# Patient Record
Sex: Female | Born: 1935 | Race: Black or African American | Hispanic: No | Marital: Single | Smoking: Former smoker
Health system: Southern US, Community
[De-identification: ages and names within clinical notes are randomized; demographics above are authoritative.]

## PROBLEM LIST (undated history)

## (undated) ENCOUNTER — Emergency Department (HOSPITAL_COMMUNITY): Admission: EM | Payer: Medicare Other

## (undated) DIAGNOSIS — K222 Esophageal obstruction: Secondary | ICD-10-CM

## (undated) DIAGNOSIS — IMO0002 Reserved for concepts with insufficient information to code with codable children: Secondary | ICD-10-CM

## (undated) DIAGNOSIS — R911 Solitary pulmonary nodule: Secondary | ICD-10-CM

## (undated) DIAGNOSIS — R1310 Dysphagia, unspecified: Secondary | ICD-10-CM

## (undated) DIAGNOSIS — J439 Emphysema, unspecified: Secondary | ICD-10-CM

## (undated) DIAGNOSIS — K5730 Diverticulosis of large intestine without perforation or abscess without bleeding: Secondary | ICD-10-CM

## (undated) DIAGNOSIS — R9430 Abnormal result of cardiovascular function study, unspecified: Secondary | ICD-10-CM

## (undated) DIAGNOSIS — R079 Chest pain, unspecified: Secondary | ICD-10-CM

## (undated) HISTORY — DX: Dysphagia, unspecified: R13.10

## (undated) HISTORY — DX: Esophageal obstruction: K22.2

## (undated) HISTORY — DX: Reserved for concepts with insufficient information to code with codable children: IMO0002

## (undated) HISTORY — DX: Diverticulosis of large intestine without perforation or abscess without bleeding: K57.30

## (undated) HISTORY — DX: Emphysema, unspecified: J43.9

## (undated) HISTORY — DX: Abnormal result of cardiovascular function study, unspecified: R94.30

## (undated) HISTORY — DX: Solitary pulmonary nodule: R91.1

## (undated) HISTORY — DX: Chest pain, unspecified: R07.9

---

## 1998-01-20 ENCOUNTER — Ambulatory Visit (HOSPITAL_COMMUNITY): Admission: RE | Admit: 1998-01-20 | Discharge: 1998-01-20 | Payer: Self-pay | Admitting: General Surgery

## 1998-03-02 ENCOUNTER — Other Ambulatory Visit: Admission: RE | Admit: 1998-03-02 | Discharge: 1998-03-02 | Payer: Self-pay | Admitting: Internal Medicine

## 1998-04-06 ENCOUNTER — Ambulatory Visit (HOSPITAL_COMMUNITY): Admission: RE | Admit: 1998-04-06 | Discharge: 1998-04-06 | Payer: Self-pay | Admitting: General Surgery

## 1998-06-28 ENCOUNTER — Ambulatory Visit (HOSPITAL_COMMUNITY): Admission: RE | Admit: 1998-06-28 | Discharge: 1998-06-28 | Payer: Self-pay | Admitting: Cardiology

## 1998-06-28 ENCOUNTER — Encounter: Payer: Self-pay | Admitting: Cardiology

## 1998-11-22 ENCOUNTER — Other Ambulatory Visit: Admission: RE | Admit: 1998-11-22 | Discharge: 1998-11-22 | Payer: Self-pay | Admitting: Obstetrics and Gynecology

## 1999-04-24 ENCOUNTER — Emergency Department (HOSPITAL_COMMUNITY): Admission: EM | Admit: 1999-04-24 | Discharge: 1999-04-25 | Payer: Self-pay | Admitting: *Deleted

## 1999-05-16 ENCOUNTER — Encounter: Payer: Self-pay | Admitting: Internal Medicine

## 1999-05-16 ENCOUNTER — Ambulatory Visit (HOSPITAL_COMMUNITY): Admission: RE | Admit: 1999-05-16 | Discharge: 1999-05-16 | Payer: Self-pay | Admitting: Internal Medicine

## 1999-06-19 ENCOUNTER — Ambulatory Visit (HOSPITAL_COMMUNITY): Admission: RE | Admit: 1999-06-19 | Discharge: 1999-06-19 | Payer: Self-pay | Admitting: *Deleted

## 1999-10-11 ENCOUNTER — Other Ambulatory Visit: Admission: RE | Admit: 1999-10-11 | Discharge: 1999-10-11 | Payer: Self-pay | Admitting: *Deleted

## 1999-11-27 ENCOUNTER — Ambulatory Visit (HOSPITAL_COMMUNITY): Admission: RE | Admit: 1999-11-27 | Discharge: 1999-11-27 | Payer: Self-pay | Admitting: *Deleted

## 2000-02-23 ENCOUNTER — Emergency Department (HOSPITAL_COMMUNITY): Admission: EM | Admit: 2000-02-23 | Discharge: 2000-02-23 | Payer: Self-pay | Admitting: Emergency Medicine

## 2000-02-23 ENCOUNTER — Encounter: Payer: Self-pay | Admitting: Emergency Medicine

## 2000-03-30 ENCOUNTER — Emergency Department (HOSPITAL_COMMUNITY): Admission: EM | Admit: 2000-03-30 | Discharge: 2000-03-30 | Payer: Self-pay | Admitting: Emergency Medicine

## 2000-03-30 ENCOUNTER — Encounter: Payer: Self-pay | Admitting: Emergency Medicine

## 2000-04-22 ENCOUNTER — Encounter: Payer: Self-pay | Admitting: Orthopedic Surgery

## 2000-04-22 ENCOUNTER — Ambulatory Visit (HOSPITAL_COMMUNITY): Admission: RE | Admit: 2000-04-22 | Discharge: 2000-04-22 | Payer: Self-pay | Admitting: Orthopedic Surgery

## 2000-04-26 ENCOUNTER — Encounter: Admission: RE | Admit: 2000-04-26 | Discharge: 2000-07-25 | Payer: Self-pay | Admitting: Otolaryngology

## 2000-05-28 ENCOUNTER — Encounter: Payer: Self-pay | Admitting: Neurosurgery

## 2000-05-28 ENCOUNTER — Ambulatory Visit (HOSPITAL_COMMUNITY): Admission: RE | Admit: 2000-05-28 | Discharge: 2000-05-28 | Payer: Self-pay | Admitting: Neurosurgery

## 2000-07-23 ENCOUNTER — Emergency Department (HOSPITAL_COMMUNITY): Admission: EM | Admit: 2000-07-23 | Discharge: 2000-07-24 | Payer: Self-pay | Admitting: Emergency Medicine

## 2000-07-23 ENCOUNTER — Encounter: Payer: Self-pay | Admitting: Emergency Medicine

## 2000-07-24 ENCOUNTER — Encounter: Payer: Self-pay | Admitting: Emergency Medicine

## 2000-08-01 ENCOUNTER — Encounter: Admission: RE | Admit: 2000-08-01 | Discharge: 2000-08-01 | Payer: Self-pay | Admitting: Urology

## 2000-08-01 ENCOUNTER — Encounter: Payer: Self-pay | Admitting: Urology

## 2000-10-08 ENCOUNTER — Encounter: Payer: Self-pay | Admitting: Urology

## 2000-10-08 ENCOUNTER — Encounter: Admission: RE | Admit: 2000-10-08 | Discharge: 2000-10-08 | Payer: Self-pay | Admitting: Urology

## 2000-11-25 ENCOUNTER — Other Ambulatory Visit: Admission: RE | Admit: 2000-11-25 | Discharge: 2000-11-25 | Payer: Self-pay | Admitting: *Deleted

## 2001-03-18 ENCOUNTER — Encounter (INDEPENDENT_AMBULATORY_CARE_PROVIDER_SITE_OTHER): Payer: Self-pay | Admitting: Specialist

## 2001-03-18 ENCOUNTER — Other Ambulatory Visit: Admission: RE | Admit: 2001-03-18 | Discharge: 2001-03-18 | Payer: Self-pay | Admitting: Internal Medicine

## 2001-06-30 ENCOUNTER — Encounter: Admission: RE | Admit: 2001-06-30 | Discharge: 2001-09-28 | Payer: Self-pay | Admitting: *Deleted

## 2001-09-19 ENCOUNTER — Encounter: Payer: Self-pay | Admitting: Emergency Medicine

## 2001-09-19 ENCOUNTER — Emergency Department (HOSPITAL_COMMUNITY): Admission: EM | Admit: 2001-09-19 | Discharge: 2001-09-19 | Payer: Self-pay | Admitting: Emergency Medicine

## 2001-11-04 ENCOUNTER — Ambulatory Visit (HOSPITAL_COMMUNITY): Admission: RE | Admit: 2001-11-04 | Discharge: 2001-11-04 | Payer: Self-pay | Admitting: Internal Medicine

## 2001-11-04 ENCOUNTER — Encounter: Payer: Self-pay | Admitting: Internal Medicine

## 2002-01-21 ENCOUNTER — Emergency Department (HOSPITAL_COMMUNITY): Admission: EM | Admit: 2002-01-21 | Discharge: 2002-01-22 | Payer: Self-pay | Admitting: Emergency Medicine

## 2002-05-14 ENCOUNTER — Encounter
Admission: RE | Admit: 2002-05-14 | Discharge: 2002-05-29 | Payer: Self-pay | Admitting: Physical Medicine and Rehabilitation

## 2003-05-01 ENCOUNTER — Emergency Department (HOSPITAL_COMMUNITY): Admission: EM | Admit: 2003-05-01 | Discharge: 2003-05-01 | Payer: Self-pay

## 2003-05-10 ENCOUNTER — Ambulatory Visit (HOSPITAL_COMMUNITY): Admission: RE | Admit: 2003-05-10 | Discharge: 2003-05-10 | Payer: Self-pay | Admitting: Internal Medicine

## 2003-05-10 ENCOUNTER — Encounter: Payer: Self-pay | Admitting: Internal Medicine

## 2004-02-02 ENCOUNTER — Emergency Department (HOSPITAL_COMMUNITY): Admission: EM | Admit: 2004-02-02 | Discharge: 2004-02-02 | Payer: Self-pay | Admitting: Emergency Medicine

## 2004-03-02 ENCOUNTER — Ambulatory Visit: Admission: RE | Admit: 2004-03-02 | Discharge: 2004-03-02 | Payer: Self-pay | Admitting: Internal Medicine

## 2004-05-22 ENCOUNTER — Ambulatory Visit (HOSPITAL_COMMUNITY): Admission: RE | Admit: 2004-05-22 | Discharge: 2004-05-22 | Payer: Self-pay | Admitting: Internal Medicine

## 2004-10-09 ENCOUNTER — Ambulatory Visit: Payer: Self-pay | Admitting: Internal Medicine

## 2004-10-10 ENCOUNTER — Ambulatory Visit: Payer: Self-pay | Admitting: Internal Medicine

## 2005-01-30 ENCOUNTER — Ambulatory Visit (HOSPITAL_COMMUNITY): Admission: RE | Admit: 2005-01-30 | Discharge: 2005-01-30 | Payer: Self-pay | Admitting: Internal Medicine

## 2005-02-18 ENCOUNTER — Emergency Department (HOSPITAL_COMMUNITY): Admission: EM | Admit: 2005-02-18 | Discharge: 2005-02-18 | Payer: Self-pay | Admitting: *Deleted

## 2005-03-19 ENCOUNTER — Ambulatory Visit: Payer: Self-pay | Admitting: Internal Medicine

## 2005-06-18 ENCOUNTER — Ambulatory Visit: Payer: Self-pay | Admitting: Internal Medicine

## 2005-06-20 ENCOUNTER — Ambulatory Visit: Payer: Self-pay | Admitting: Cardiology

## 2005-10-22 ENCOUNTER — Ambulatory Visit (HOSPITAL_COMMUNITY): Admission: RE | Admit: 2005-10-22 | Discharge: 2005-10-22 | Payer: Self-pay | Admitting: Internal Medicine

## 2005-11-01 ENCOUNTER — Ambulatory Visit: Payer: Self-pay | Admitting: Internal Medicine

## 2005-12-12 ENCOUNTER — Ambulatory Visit: Payer: Self-pay | Admitting: Cardiology

## 2005-12-21 ENCOUNTER — Ambulatory Visit: Payer: Self-pay

## 2005-12-24 ENCOUNTER — Ambulatory Visit: Payer: Self-pay | Admitting: Cardiology

## 2006-01-10 ENCOUNTER — Ambulatory Visit: Payer: Self-pay | Admitting: Internal Medicine

## 2006-01-15 ENCOUNTER — Ambulatory Visit: Payer: Self-pay | Admitting: *Deleted

## 2006-01-25 ENCOUNTER — Ambulatory Visit: Payer: Self-pay | Admitting: Internal Medicine

## 2006-01-28 ENCOUNTER — Ambulatory Visit (HOSPITAL_COMMUNITY): Admission: RE | Admit: 2006-01-28 | Discharge: 2006-01-28 | Payer: Self-pay | Admitting: Internal Medicine

## 2006-02-25 ENCOUNTER — Ambulatory Visit: Payer: Self-pay | Admitting: Internal Medicine

## 2006-03-06 ENCOUNTER — Ambulatory Visit: Payer: Self-pay | Admitting: Internal Medicine

## 2006-05-03 ENCOUNTER — Ambulatory Visit: Payer: Self-pay

## 2006-08-20 ENCOUNTER — Ambulatory Visit: Payer: Self-pay | Admitting: Internal Medicine

## 2006-09-02 ENCOUNTER — Ambulatory Visit: Payer: Self-pay | Admitting: Internal Medicine

## 2006-11-29 ENCOUNTER — Ambulatory Visit: Payer: Self-pay | Admitting: Cardiology

## 2007-04-21 ENCOUNTER — Ambulatory Visit: Payer: Self-pay | Admitting: Cardiology

## 2007-04-22 ENCOUNTER — Ambulatory Visit: Payer: Self-pay | Admitting: Surgery

## 2007-04-22 ENCOUNTER — Encounter (INDEPENDENT_AMBULATORY_CARE_PROVIDER_SITE_OTHER): Payer: Self-pay | Admitting: *Deleted

## 2007-04-22 ENCOUNTER — Inpatient Hospital Stay (HOSPITAL_COMMUNITY): Admission: EM | Admit: 2007-04-22 | Discharge: 2007-04-24 | Payer: Self-pay | Admitting: Emergency Medicine

## 2007-04-23 ENCOUNTER — Encounter (INDEPENDENT_AMBULATORY_CARE_PROVIDER_SITE_OTHER): Payer: Self-pay | Admitting: Internal Medicine

## 2008-02-17 ENCOUNTER — Ambulatory Visit: Payer: Self-pay | Admitting: Vascular Surgery

## 2008-05-28 ENCOUNTER — Inpatient Hospital Stay (HOSPITAL_COMMUNITY): Admission: EM | Admit: 2008-05-28 | Discharge: 2008-05-31 | Payer: Self-pay | Admitting: Emergency Medicine

## 2008-07-12 DIAGNOSIS — I2510 Atherosclerotic heart disease of native coronary artery without angina pectoris: Secondary | ICD-10-CM | POA: Insufficient documentation

## 2008-07-12 DIAGNOSIS — K319 Disease of stomach and duodenum, unspecified: Secondary | ICD-10-CM | POA: Insufficient documentation

## 2008-07-12 DIAGNOSIS — J438 Other emphysema: Secondary | ICD-10-CM | POA: Insufficient documentation

## 2008-07-12 DIAGNOSIS — K648 Other hemorrhoids: Secondary | ICD-10-CM | POA: Insufficient documentation

## 2008-07-12 DIAGNOSIS — J984 Other disorders of lung: Secondary | ICD-10-CM | POA: Insufficient documentation

## 2008-07-12 DIAGNOSIS — K219 Gastro-esophageal reflux disease without esophagitis: Secondary | ICD-10-CM

## 2008-07-12 DIAGNOSIS — I219 Acute myocardial infarction, unspecified: Secondary | ICD-10-CM | POA: Insufficient documentation

## 2008-07-12 DIAGNOSIS — K5730 Diverticulosis of large intestine without perforation or abscess without bleeding: Secondary | ICD-10-CM | POA: Insufficient documentation

## 2008-07-13 ENCOUNTER — Ambulatory Visit: Payer: Self-pay | Admitting: Internal Medicine

## 2008-07-13 DIAGNOSIS — R1319 Other dysphagia: Secondary | ICD-10-CM

## 2008-07-20 ENCOUNTER — Ambulatory Visit: Payer: Self-pay | Admitting: Internal Medicine

## 2008-07-20 ENCOUNTER — Telehealth: Payer: Self-pay | Admitting: Internal Medicine

## 2008-12-28 ENCOUNTER — Ambulatory Visit: Payer: Self-pay | Admitting: Vascular Surgery

## 2008-12-28 ENCOUNTER — Ambulatory Visit (HOSPITAL_COMMUNITY): Admission: RE | Admit: 2008-12-28 | Discharge: 2008-12-28 | Payer: Self-pay | Admitting: Internal Medicine

## 2009-02-24 ENCOUNTER — Encounter: Admission: RE | Admit: 2009-02-24 | Discharge: 2009-02-24 | Payer: Self-pay | Admitting: Internal Medicine

## 2009-10-02 ENCOUNTER — Encounter: Admission: RE | Admit: 2009-10-02 | Discharge: 2009-10-02 | Payer: Self-pay | Admitting: Orthopaedic Surgery

## 2009-10-28 ENCOUNTER — Observation Stay (HOSPITAL_COMMUNITY): Admission: EM | Admit: 2009-10-28 | Discharge: 2009-10-29 | Payer: Self-pay | Admitting: Emergency Medicine

## 2009-12-20 ENCOUNTER — Emergency Department (HOSPITAL_COMMUNITY): Admission: EM | Admit: 2009-12-20 | Discharge: 2009-12-20 | Payer: Self-pay | Admitting: Emergency Medicine

## 2009-12-20 ENCOUNTER — Encounter (INDEPENDENT_AMBULATORY_CARE_PROVIDER_SITE_OTHER): Payer: Self-pay | Admitting: Emergency Medicine

## 2009-12-20 ENCOUNTER — Ambulatory Visit: Payer: Self-pay | Admitting: Vascular Surgery

## 2010-01-16 ENCOUNTER — Emergency Department (HOSPITAL_COMMUNITY): Admission: EM | Admit: 2010-01-16 | Discharge: 2010-01-16 | Payer: Self-pay | Admitting: *Deleted

## 2010-02-09 ENCOUNTER — Ambulatory Visit (HOSPITAL_COMMUNITY): Admission: RE | Admit: 2010-02-09 | Discharge: 2010-02-09 | Payer: Self-pay | Admitting: Internal Medicine

## 2010-02-24 ENCOUNTER — Inpatient Hospital Stay (HOSPITAL_COMMUNITY): Admission: EM | Admit: 2010-02-24 | Discharge: 2010-02-26 | Payer: Self-pay | Admitting: Emergency Medicine

## 2010-08-27 ENCOUNTER — Encounter: Payer: Self-pay | Admitting: Internal Medicine

## 2010-10-21 LAB — TROPONIN I: Troponin I: 0.01 ng/mL (ref 0.00–0.06)

## 2010-10-21 LAB — DIFFERENTIAL
Basophils Relative: 1 % (ref 0–1)
Eosinophils Absolute: 0 10*3/uL (ref 0.0–0.7)
Lymphs Abs: 1.5 10*3/uL (ref 0.7–4.0)
Neutro Abs: 3.1 10*3/uL (ref 1.7–7.7)
Neutrophils Relative %: 62 % (ref 43–77)

## 2010-10-21 LAB — COMPREHENSIVE METABOLIC PANEL
ALT: 9 U/L (ref 0–35)
AST: 16 U/L (ref 0–37)
Albumin: 3.9 g/dL (ref 3.5–5.2)
Alkaline Phosphatase: 49 U/L (ref 39–117)
BUN: 3 mg/dL — ABNORMAL LOW (ref 6–23)
Chloride: 106 mEq/L (ref 96–112)
Potassium: 3.1 mEq/L — ABNORMAL LOW (ref 3.5–5.1)
Sodium: 142 mEq/L (ref 135–145)
Total Bilirubin: 1.1 mg/dL (ref 0.3–1.2)
Total Protein: 6.5 g/dL (ref 6.0–8.3)

## 2010-10-21 LAB — CK TOTAL AND CKMB (NOT AT ARMC): CK, MB: 0.8 ng/mL (ref 0.3–4.0)

## 2010-10-21 LAB — BASIC METABOLIC PANEL
BUN: 4 mg/dL — ABNORMAL LOW (ref 6–23)
Calcium: 9.1 mg/dL (ref 8.4–10.5)
Chloride: 109 mEq/L (ref 96–112)
Creatinine, Ser: 0.93 mg/dL (ref 0.4–1.2)
Creatinine, Ser: 1 mg/dL (ref 0.4–1.2)
GFR calc Af Amer: >60 mL/min (ref >60)
GFR calc non Af Amer: 59 mL/min — ABNORMAL LOW (ref >60)

## 2010-10-21 LAB — CARDIAC PANEL(CRET KIN+CKTOT+MB+TROPI)
CK, MB: 0.8 ng/mL (ref 0.3–4.0)
Relative Index: INVALID (ref 0.0–2.5)
Troponin I: 0.01 ng/mL (ref 0.00–0.06)
Troponin I: 0.01 ng/mL (ref 0.00–0.06)

## 2010-10-21 LAB — CBC
HCT: 41.6 % (ref 36.0–46.0)
MCH: 30.5 pg (ref 26.0–34.0)
MCV: 89.2 fL (ref 78.0–100.0)
Platelets: 136 10*3/uL — ABNORMAL LOW (ref 150–400)
Platelets: 148 10*3/uL — ABNORMAL LOW (ref 150–400)
RBC: 4.24 MIL/uL (ref 3.87–5.11)
RBC: 4.66 MIL/uL (ref 3.87–5.11)
RDW: 14 % (ref 11.5–15.5)
WBC: 5.1 10*3/uL (ref 4.0–10.5)
WBC: 5.4 10*3/uL (ref 4.0–10.5)

## 2010-10-21 LAB — URINALYSIS, ROUTINE W REFLEX MICROSCOPIC
Glucose, UA: NEGATIVE mg/dL
Hgb urine dipstick: NEGATIVE
Ketones, ur: 15 mg/dL — AB
Protein, ur: NEGATIVE mg/dL
pH: 7 (ref 5.0–8.0)

## 2010-10-21 LAB — LIPASE, BLOOD: Lipase: 107 U/L — ABNORMAL HIGH (ref 11–59)

## 2010-10-21 LAB — GLUCOSE, CAPILLARY: Glucose-Capillary: 128 mg/dL — ABNORMAL HIGH (ref 70–99)

## 2010-10-21 LAB — URINE CULTURE
Colony Count: 25000
Culture  Setup Time: 201107230100

## 2010-10-21 LAB — POCT CARDIAC MARKERS

## 2010-10-21 LAB — D-DIMER, QUANTITATIVE: D-Dimer, Quant: 0.82 ug/mL-FEU — ABNORMAL HIGH (ref 0.00–0.48)

## 2010-10-21 LAB — LIPID PANEL
HDL: 45 mg/dL (ref >39)
Total CHOL/HDL Ratio: 3.1 RATIO
Triglycerides: 76 mg/dL (ref <150)
VLDL: 15 mg/dL (ref 0–40)

## 2010-10-21 LAB — MAGNESIUM: Magnesium: 2 mg/dL (ref 1.5–2.5)

## 2010-10-21 LAB — URINE MICROSCOPIC-ADD ON

## 2010-10-23 LAB — DIFFERENTIAL
Basophils Absolute: 0 10*3/uL (ref 0.0–0.1)
Basophils Absolute: 0 10*3/uL (ref 0.0–0.1)
Lymphocytes Relative: 33 % (ref 12–46)
Lymphocytes Relative: 32 % (ref 12–46)
Lymphs Abs: 1.9 10*3/uL (ref 0.7–4.0)
Neutro Abs: 3.7 10*3/uL (ref 1.7–7.7)
Neutro Abs: 3.6 10*3/uL (ref 1.7–7.7)
Neutrophils Relative %: 60 % (ref 43–77)

## 2010-10-23 LAB — BASIC METABOLIC PANEL
BUN: 8 mg/dL (ref 6–23)
Calcium: 9.2 mg/dL (ref 8.4–10.5)
Calcium: 9.1 mg/dL (ref 8.4–10.5)
Creatinine, Ser: 1.06 mg/dL (ref 0.4–1.2)
Creatinine, Ser: 0.99 mg/dL (ref 0.4–1.2)
GFR calc non Af Amer: 51 mL/min — ABNORMAL LOW (ref >60)
GFR calc non Af Amer: 55 mL/min — ABNORMAL LOW (ref >60)
Glucose, Bld: 101 mg/dL — ABNORMAL HIGH (ref 70–99)
Glucose, Bld: 93 mg/dL (ref 70–99)
Sodium: 143 mEq/L (ref 135–145)

## 2010-10-23 LAB — URINE CULTURE
Colony Count: NO GROWTH
Culture: NO GROWTH

## 2010-10-23 LAB — URINALYSIS, ROUTINE W REFLEX MICROSCOPIC
Glucose, UA: NEGATIVE mg/dL
Glucose, UA: NEGATIVE mg/dL
Hgb urine dipstick: NEGATIVE
Ketones, ur: NEGATIVE mg/dL
Ketones, ur: NEGATIVE mg/dL
Nitrite: NEGATIVE
Protein, ur: NEGATIVE mg/dL
Specific Gravity, Urine: 1.01 (ref 1.005–1.030)
pH: 8 (ref 5.0–8.0)

## 2010-10-23 LAB — URINE MICROSCOPIC-ADD ON

## 2010-10-23 LAB — HEPATIC FUNCTION PANEL
ALT: 12 U/L (ref 0–35)
ALT: 10 U/L (ref 0–35)
AST: 18 U/L (ref 0–37)
AST: 15 U/L (ref 0–37)
Bilirubin, Direct: <0.1 mg/dL (ref 0.0–0.3)
Indirect Bilirubin: 0.5 mg/dL (ref 0.3–0.9)
Total Protein: 6.6 g/dL (ref 6.0–8.3)
Total Protein: 6.2 g/dL (ref 6.0–8.3)

## 2010-10-23 LAB — CBC
HCT: 37.3 % (ref 36.0–46.0)
Platelets: 163 10*3/uL (ref 150–400)
Platelets: 179 10*3/uL (ref 150–400)
RDW: 13.3 % (ref 11.5–15.5)
RDW: 14 % (ref 11.5–15.5)
WBC: 6 10*3/uL (ref 4.0–10.5)

## 2010-10-23 LAB — POCT CARDIAC MARKERS
CKMB, poc: <1 ng/mL — ABNORMAL LOW (ref 1.0–8.0)
CKMB, poc: <1 ng/mL — ABNORMAL LOW (ref 1.0–8.0)
Myoglobin, poc: 40.4 ng/mL (ref 12–200)
Myoglobin, poc: 51.1 ng/mL (ref 12–200)

## 2010-10-30 LAB — BASIC METABOLIC PANEL
CO2: 27 mEq/L (ref 19–32)
Calcium: 9.5 mg/dL (ref 8.4–10.5)
Chloride: 108 mEq/L (ref 96–112)
Creatinine, Ser: 0.97 mg/dL (ref 0.4–1.2)
Creatinine, Ser: 1.12 mg/dL (ref 0.4–1.2)
GFR calc Af Amer: 58 mL/min — ABNORMAL LOW (ref >60)
GFR calc Af Amer: >60 mL/min (ref >60)
GFR calc non Af Amer: 48 mL/min — ABNORMAL LOW (ref >60)
Glucose, Bld: 157 mg/dL — ABNORMAL HIGH (ref 70–99)
Potassium: 4.1 mEq/L (ref 3.5–5.1)

## 2010-10-30 LAB — URINALYSIS, ROUTINE W REFLEX MICROSCOPIC
Bilirubin Urine: NEGATIVE
Glucose, UA: NEGATIVE mg/dL
Hgb urine dipstick: NEGATIVE
Ketones, ur: NEGATIVE mg/dL
pH: 5.5 (ref 5.0–8.0)

## 2010-10-30 LAB — DIFFERENTIAL
Basophils Absolute: 0 10*3/uL (ref 0.0–0.1)
Basophils Relative: 0 % (ref 0–1)
Lymphocytes Relative: 22 % (ref 12–46)
Neutro Abs: 5.1 10*3/uL (ref 1.7–7.7)
Neutrophils Relative %: 70 % (ref 43–77)

## 2010-10-30 LAB — LIPID PANEL
HDL: 46 mg/dL (ref >39)
Total CHOL/HDL Ratio: 4.3 RATIO

## 2010-10-30 LAB — CBC
MCHC: 34.1 g/dL (ref 30.0–36.0)
RBC: 4.53 MIL/uL (ref 3.87–5.11)
RDW: 14.7 % (ref 11.5–15.5)

## 2010-10-30 LAB — URINE MICROSCOPIC-ADD ON

## 2010-10-30 LAB — CARDIAC PANEL(CRET KIN+CKTOT+MB+TROPI)
CK, MB: 0.7 ng/mL (ref 0.3–4.0)
Relative Index: INVALID (ref 0.0–2.5)
Troponin I: <0.01 ng/mL (ref 0.00–0.06)
Troponin I: <0.01 ng/mL (ref 0.00–0.06)

## 2010-10-30 LAB — CK TOTAL AND CKMB (NOT AT ARMC)
CK, MB: 0.6 ng/mL (ref 0.3–4.0)
Relative Index: INVALID (ref 0.0–2.5)
Total CK: 55 U/L (ref 7–177)

## 2010-10-30 LAB — BRAIN NATRIURETIC PEPTIDE: Pro B Natriuretic peptide (BNP): <30 pg/mL (ref 0.0–100.0)

## 2010-10-30 LAB — LIPASE, BLOOD: Lipase: 59 U/L (ref 11–59)

## 2010-10-30 LAB — POCT CARDIAC MARKERS

## 2010-11-03 IMAGING — CR DG CHEST 2V
2 series · 2 of 2 positions shown · non-contrast
Comparison: 12/20/2009

CLINICAL DATA: Chest pain

CHEST - 2 VIEW

[w chest pa]
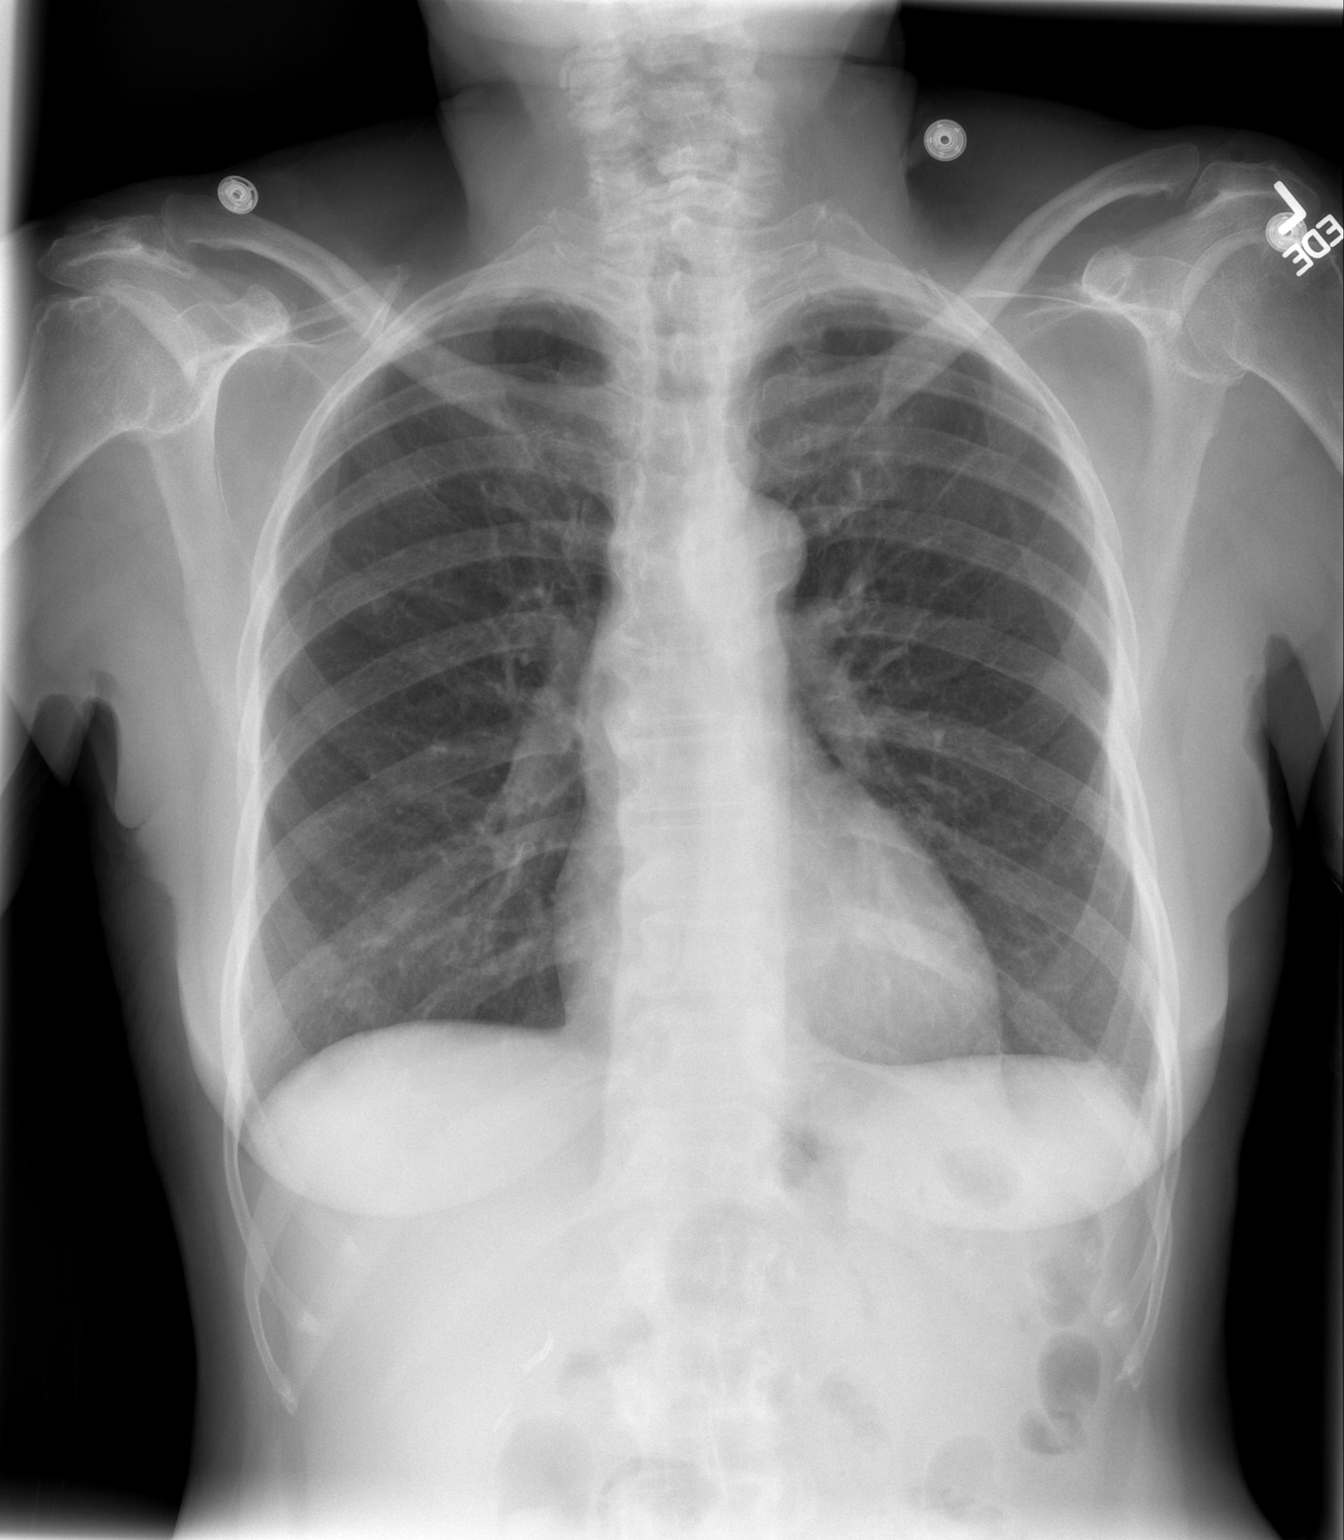

[w chest lat]
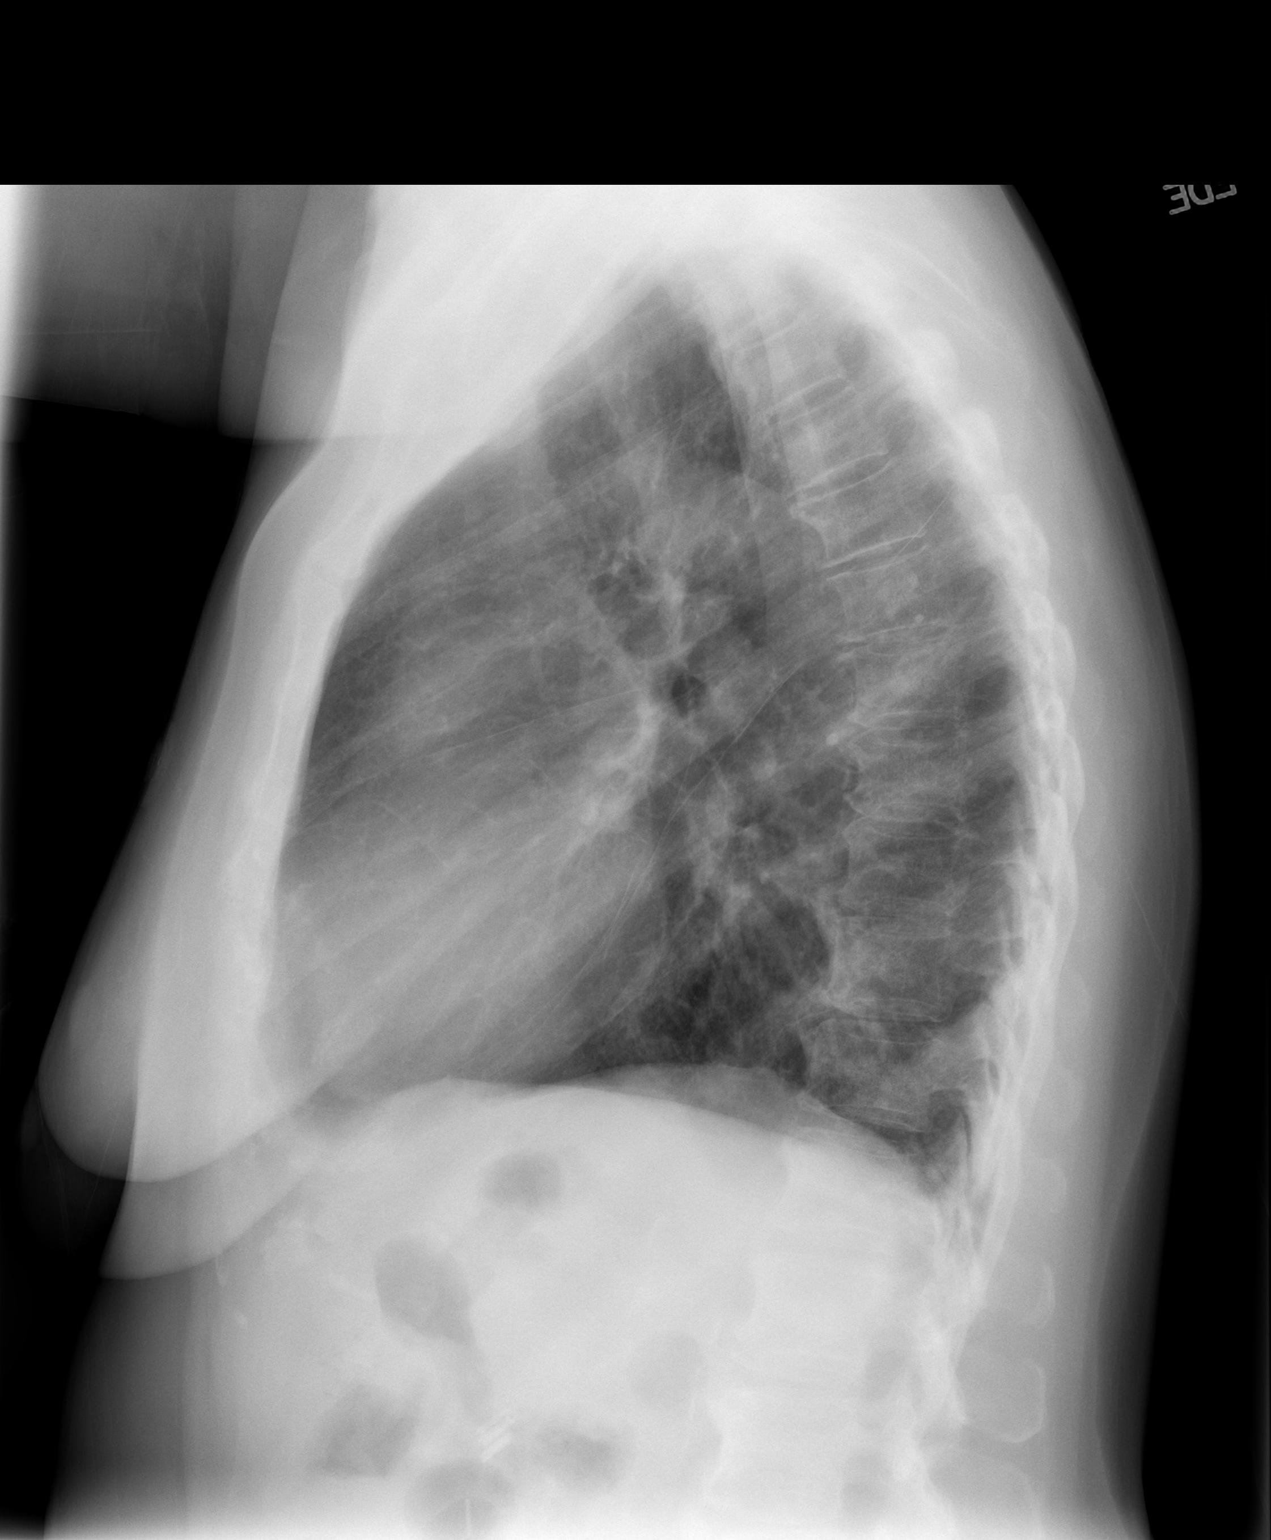

[2 of 2 positions shown; findings below may reference images not displayed]

FINDINGS: Upper normal heart size.  Lungs are clear.  No
pneumothorax.  No pleural effusion.
IMPRESSION: No active cardiopulmonary disease.

## 2010-11-27 IMAGING — CR DG KNEE 1-2V*R*
3 series · 3 of 3 positions shown · non-contrast
Comparison: 12/20/2009

CLINICAL DATA: Right knee pain with swelling.  No known injury.  No
focal point

RIGHT KNEE - 1-2 VIEW

[view not recorded (1 of 3)]
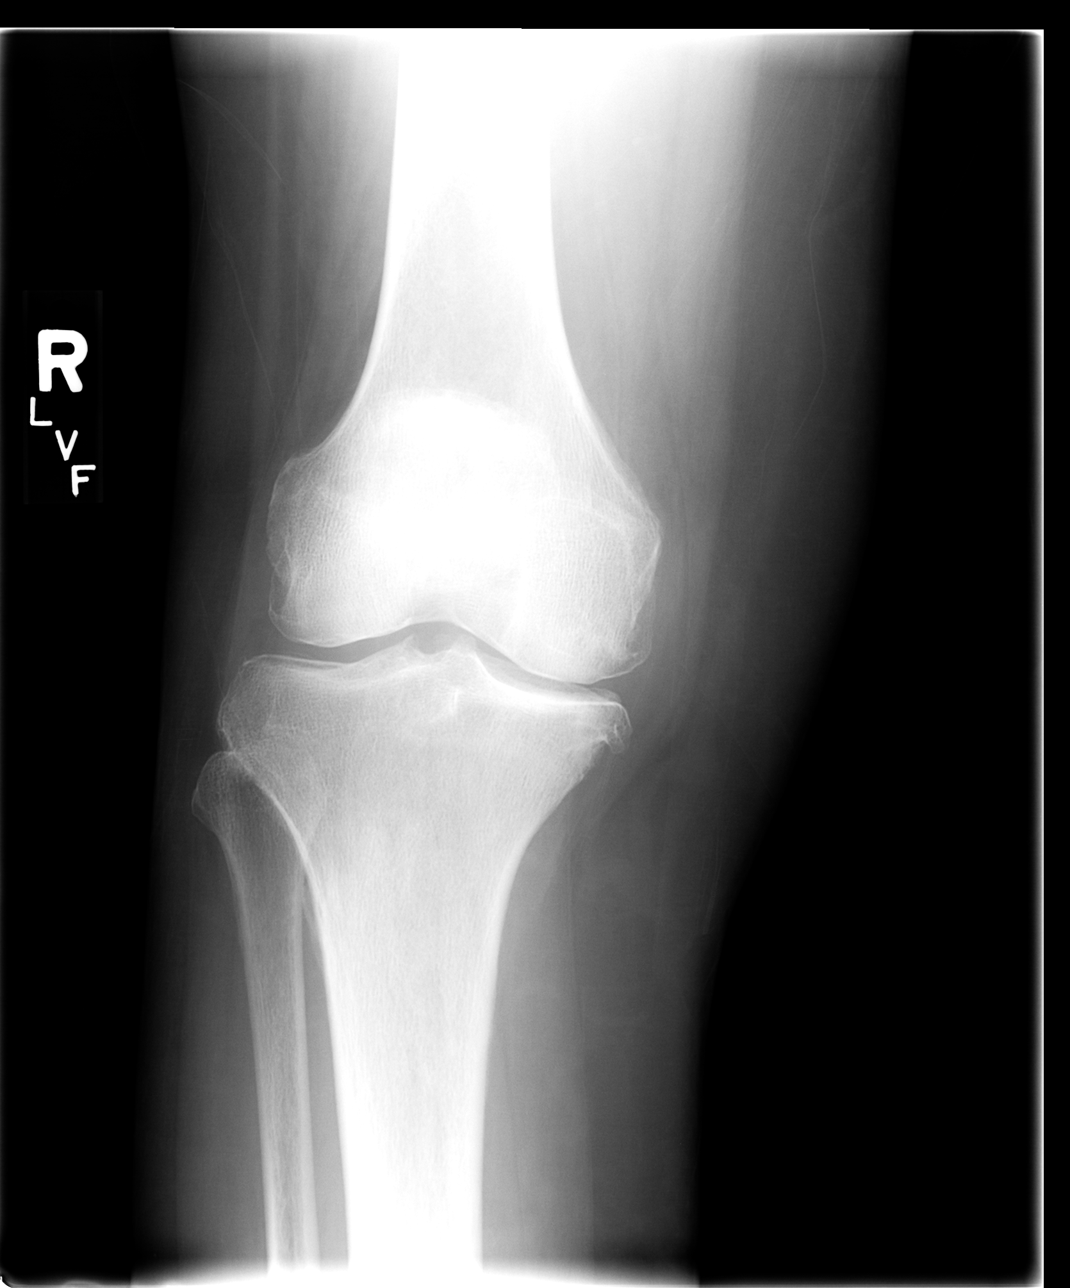

[view not recorded (2 of 3)]
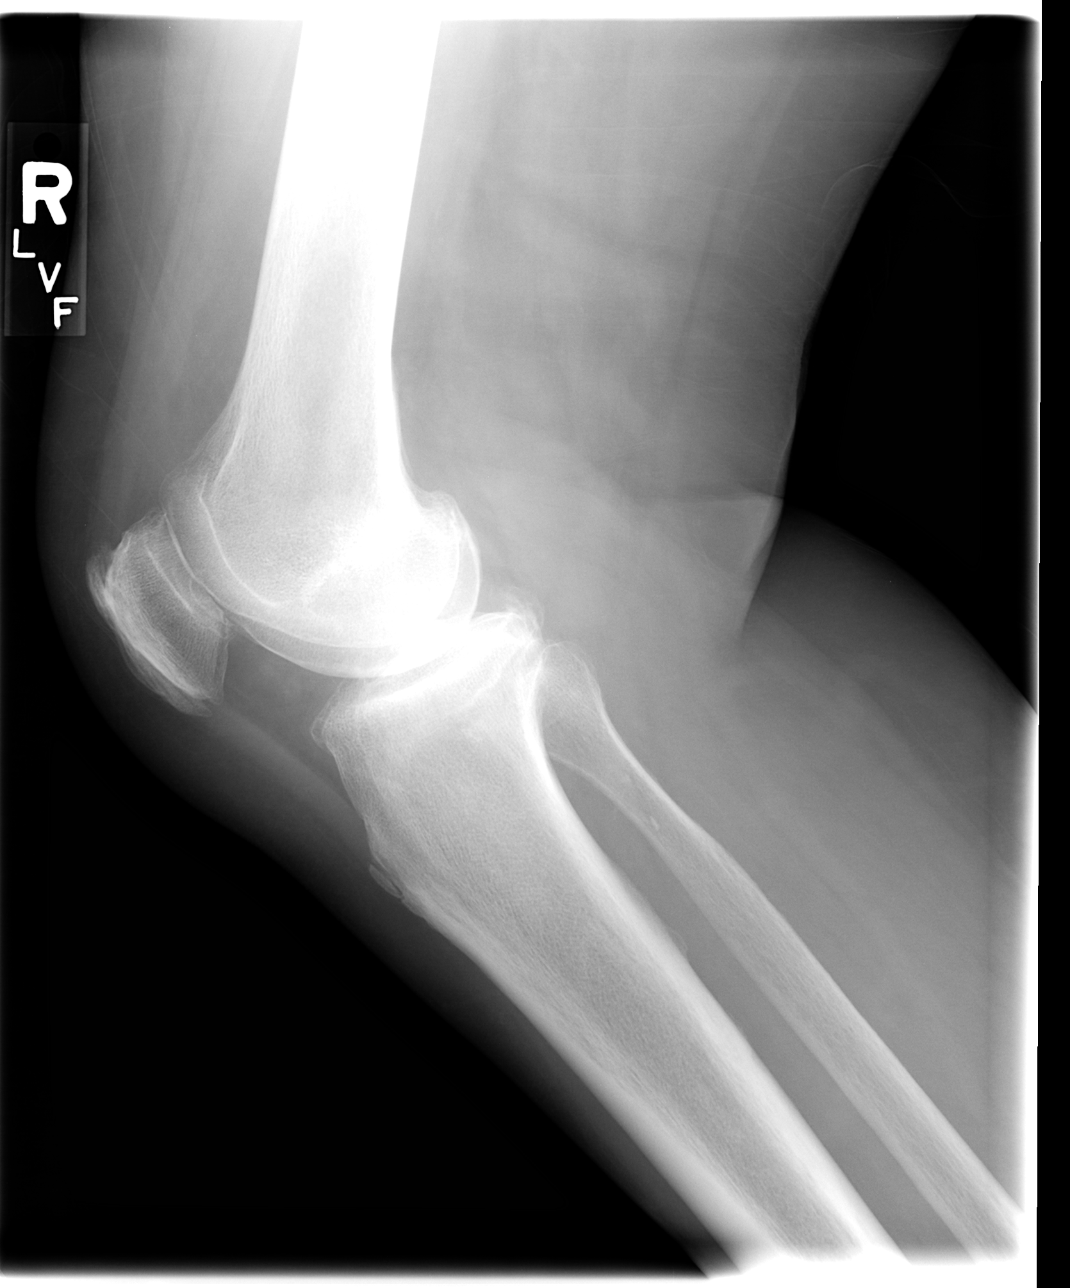

[view not recorded (3 of 3)]
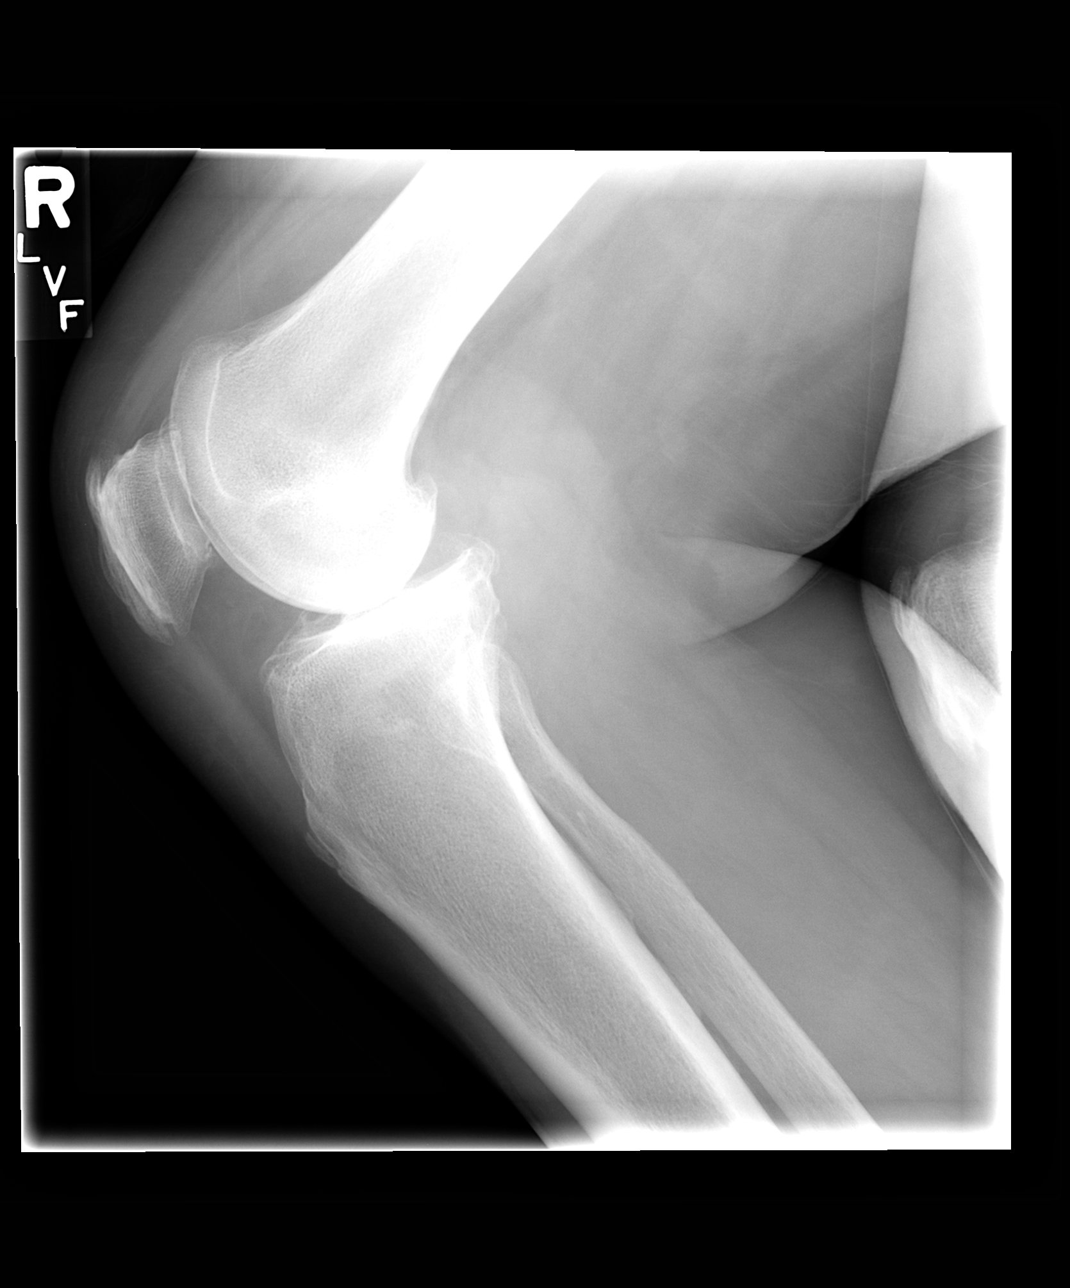

[3 of 3 positions shown; findings below may reference images not displayed]

FINDINGS: Bone density is within normal limits.  There is medial
and patellofemoral joint space narrowing which is stable in
comparison with the prior exam.  Subchondral sclerosis, tibial
spine spurring and medial tibial osteophytosis is also identified
and these findings would correlate with medial compartment
osteoarthritis.  No evidence for acute fracture or dislocation is
seen.  No joint effusion is noted.  No focal bony abnormality is
seen. No soft tissue abnormality is noted.
IMPRESSION: Stable degenerative osteoarthritis of the medial and patellofemoral
compartments.  No new focal or acute abnormality suggested.

## 2010-12-19 NOTE — H&P (Signed)
Sharon [name], [name]              ACCOUNT NO.:  [id number]   MEDICAL RECORD NO.:  [id number]          PATIENT TYPE:  EMS   LOCATION:  ED                           FACILITY:  [facility]   PHYSICIAN:  Beckey Rutter, MD  DATE OF BIRTH:  01/21/1933   DATE OF ADMISSION:  04/21/2007  DATE OF DISCHARGE:                              HISTORY & PHYSICAL   PRIMARY CARE PHYSICIAN:  Lovenia Kim, D.O.   CHIEF COMPLAINT:  Chest pain   HISTORY OF PRESENT ILLNESS:  This is a 77 year old female with past  medical history significant for mild dementia, presented today with  chest pain.  The patient stated the chest pain started since she woke up  this morning.  The pain is mainly in the anterior chest area with  radiation to the left flank.  The pain is pressure in nature and  associated somehow with shortness of breath.  The patient's pain as she  describes it does not radiate to the back or to the neck.  The patient  is around 7/10 and she could not remember relieving factor or  aggravating factor.  The patient is started when she was lying on the  bed after she woke up.  The patient described the feeling of nausea, but  she reports no vomiting.  She also denied headache and fever, but she  admitted to shortness of breath that she could not really give more  details about it.   PAST MEDICAL HISTORY:  1. Significant for coronary artery disease as she gave from the      history, but there is no proof of that.  In fact, the patient has a      visit to the [facility] on April 2008 as seen by Dr.      Willa Rough, and she also did not find any evidence or prove even      after Myoview done at that time.  2. History of lung nodules.  3. Mild dementia.  Patient is taking Aricept for that.   ALLERGIES:  The patient is not known to have medication allergy.   MEDICATION:  1. Aricept.  2. Tramadol/acetaminophen.  3. Darvocet N-100.   SOCIAL HISTORY:  The patient lives alone.  She had a  live-in aide.  Denied drug abuse.  Denies ethanol abuse or tobacco abuse.   FAMILY HISTORY:  Noncontributory.   REVIEW OF SYSTEMS:  As per HPI.  The rest of the system review is  unremarkable.   PHYSICAL EXAMINATION:  VITAL SIGNS:  Temperature 97.2, blood pressure  165.95, pulse 72, respiratory rate 18.  HEENT:  Head atraumatic, normocephalic.  Eyes: PERRL.  Mouth moist.  No  ulcer.  NECK:  Supple.  No JVD.  LUNGS:  Decreased air entry bilaterally.  Precordium examination.  First  and second heart sound audible.  No added sounds.  The patient has area  of tenderness in the left lower sternum area that is tender to touch and  the patient is jumpy.  The patient describes that area hurts most and  that is the feeling of tightness  and pain she is experiencing.  ABDOMEN:  Soft, nontender.  Bowel sounds present.  EXTREMITIES:  No lower extremity edema, though the left leg seems  slightly swollen compared to the right.   LABORATORY DATA:  EKG showing normal sinus rhythm with rate of 68.  EKG  showed no change compared to the previous EKG done on February 18, 2005.  D-  dimer is 0.48.  Sodium 137, potassium 2.8, chloride 106, bicarb 25,  glucose 117, BUN 5, creatinine 0.96, CK-MB is less than 1.0.  Troponin  is less than 0.05, myoglobin is 92.6, white blood count 7.6, hemoglobin  13.4, hematocrit 40.3, platelet 220.   ASSESSMENT AND PLAN:  1. This is a 77 year old female who presents with chest pain.  Will be      admitted to rule out acute coronary syndrome.      a.     The patient will be admitted to telemetry floor.      b.     Will rule out acute coronary syndrome with serial cardiac       enzymes and EKG every 8 hours.  Likely, the pain is just       musculoskeletal since the patient has point of tenderness and       overall EKG as well as negative emergency room cardiac enzymes.  2. The patient has history of lung nodule last year on the CAT scan on      the computer.  I do not see  follow-up for those nodules.  I will      order CT scan with PE protocol.  Hence, the D-dimer is elevated to      0.48 which is slight elevation, also considering the patient needs      follow up for her nodule, I will go ahead and order the CT chest      with PE protocol.  Hopefully, this will also answer the symptoms of      shortness of breath the patient is experiencing and complaining of      now.  I will also consider lower extremity Dopplers to rule out      DVT.  Hence, there is very slight asymmetry on her limbs.  3. Hypokalemia.  The patient's potassium will be repeated orally, and      we will check her potassium level in the morning.  For DVT      prophylaxis, I will consider Lovenox.      Beckey Rutter, MD  Electronically Signed     EME/MEDQ  D:  04/22/2007  T:  04/22/2007  Job:  [id number]

## 2010-12-19 NOTE — Discharge Summary (Signed)
Sharon [name], Sharon [name]              ACCOUNT NO.:  [id number]   MEDICAL RECORD NO.:  [id number]          PATIENT TYPE:  INP   LOCATION:  1439                         FACILITY:  [facility]   PHYSICIAN:  Hillery Aldo, M.D.   DATE OF BIRTH:  08-20-1932   DATE OF ADMISSION:  04/21/2007  DATE OF DISCHARGE:  04/24/2007                               DISCHARGE SUMMARY   PRIMARY CARE PHYSICIAN:  Lovenia Kim, D.O.   DISCHARGE DIAGNOSES:  1. Noncardiac chest pain  2. Stable right upper lobe lung nodule.  3. Emphysema.  4. Dementia.  5. Dyslipidemia.  6. Transient hypoglycemia.  7. Hiatal hernia.  8. Mild renal insufficiency, glomerular filtration rate estimated at      59 mL/min.   DISCHARGE MEDICATIONS:  1. Aricept 10 mg q.h.s.  2. Simvastatin 20 mg q.h.s.  3. Protonix 40 mg daily.  4. Xopenex 2 puffs p.r.n. shortness of breath.  5. Aspirin 81 mg daily.  6. Tramadol 25-50 mg q.6h. p.r.n.  7. Hydrochlorothiazide 12.5 mg daily.   CONSULTATIONS:  None.   BRIEF ADMISSION HISTORY OF PRESENT ILLNESS:  The patient is a 77-year-  old female followed by Dr. Myrtis Ser of [facility] for chronic chest  pain.  She presented to the hospital on the date of admission with  complaints of atypical chest pain radiating to the left flank.  She was  admitted for further evaluation and workup.  For full details, please  see the dictated report done by Dr. Tamsen Roers.   PROCEDURES AND DIAGNOSTIC STUDIES:  1. Chest x-ray on April 21, 2007, showed no active disease.  2. CT angiogram of the chest on April 22, 2007, showed no evidence      of acute pulmonary embolism.  There was a moderate hiatal hernia.      Mild pulmonary emphysema.  Stable small right upper lobe pulmonary      nodules, consistent with benign etiology.  3. A 2-D echocardiogram on April 23, 2007, showed left ventricular      systolic function was normal.  Ejection fraction was estimated to      be 65-70%.  There was no  diagnostic evidence of left ventricular      regional wall motion abnormalities.   DISCHARGE LABORATORY VALUES:  Sodium was 140, potassium 3.2 (repleted  prior to discharge), chloride 107, bicarb 27, BUN 7, creatinine 1.10,  glucose 109.  White blood cell count was 5.8, hemoglobin 12.2,  hematocrit 35.6, platelets 181.   HOSPITAL COURSE:  Problem 1.  ATYPICAL CHEST PAIN:  The patient was  admitted to a telemetry unit and monitored closely.  Cardiac enzymes  were cycled q.8h. x3 sets and were negative.  The patient had no  evidence of pulmonary embolism.  She had been evaluated by a  cardiologist in the past and it was felt that her atypical chest pain  was likely due to GI or musculoskeletal origin.  At this point, no  further cardiac workup is indicated and she is stable for discharge  home.  She is not complaining of any further chest pain at this time.  Problem 2.  RIGHT UPPER LOBE LUNG NODULE:  The patient did have a follow-  up CT scan in the hospital, which shows that this nodule is stable.   Problem 3.  EMPHYSEMA:  The patient was started on bronchodilator  therapy with Xopenex.  We will switch this over to a metered-dose  inhaler and teach her how to use this prior to discharge.   Problem 4.  DEMENTIA:  The patient was continued on her usual dose of  Aricept.   Problem 5.  DYSLIPIDEMIA:  Unclear if the patient has been taking her  simvastatin.  It was noted by Dr. Myrtis Ser that she had been taking it as of  April 2008.  She was continued on simvastatin here in the hospital and  will be sent home with a prescription for this.  She did have a fasting  lipid panel done, which showed a total cholesterol of 176, triglycerides  of 85, HDL 39, and LDL 120.   Problem 6.  HYPOGLYCEMIA:  The patient had some transient hypoglycemia.  Dextrose was added to her IV fluids and her glucose level is now normal.   DISPOSITION:  The patient is medically stable for discharge today.  She   should follow up with her primary care physician in 1-2 weeks.      Hillery Aldo, M.D.  Electronically Signed     CR/MEDQ  D:  04/24/2007  T:  04/24/2007  Job:  [id number]   cc:   Lovenia Kim, D.O.  Fax: [phone]

## 2010-12-19 NOTE — Discharge Summary (Signed)
Sharon [name], Sharon [name]              ACCOUNT NO.:  [id number]   MEDICAL RECORD NO.:  [id number]          PATIENT TYPE:  INP   LOCATION:  1334                         [facility]ACILITY:  [facility]   PHYSICIAN:  Eduard Clos, MDDATE OF BIRTH:  September 16, 1932   DATE OF ADMISSION:  05/27/2008  DATE OF DISCHARGE:  05/31/2008                               DISCHARGE SUMMARY   COURSE IN THE HOSPITAL:  A 77 year old female with history of  hyperlipidemia, depression gastritis, mild early dementia presented with  abdominal pain and chest pain.  The patient's cardiac enzymes were  cycled which were within acceptable limits.  On admission the patient  did have mildly elevated lipase.  The patient's abdominal pain and chest  pain was felt to be secondary to acute pancreatitis.  The patient  underwent a sonogram of the abdomen which showed a surgically absent  gallbladder with normal CBD.  It did show a very mild right-sided  hydronephrosis with no obstructing calculus.  The patient underwent a CT  of the abdomen and pelvis which showed no hydronephrosis and did show  pancreatitis with no complications and hiatal hernia.  The patient was  started on clear liquid diet and slowly advanced.  At this time the  patient is able to tolerate a diet and pain has largely resolved.  The  patient is being discharged home.  The patient in her CT of the abdomen  and pelvis did show a large diverticulum arising from the rectosigmoid  colon which I did advise the patient that she will need further workup  on this as outpatient through her primary care physician which I also  addressed to her son and they did agree.   PROCEDURES DURING THIS STAY:  Sonogram of abdomen on May 28, 2008.  Surgically absent gallbladder, normal CBD.  There is very mild right-  sided hydronephrosis.  No obstructing calculus.  The left kidney  noted.  Chest x-ray:  No acute cardiopulmonary disease.  Stable changes of COPD.  Right upper lobe  pulmonary nodule seen.  CAT scan of the abdomen and  pelvis:  Negative for obstructive uropathy or ureterolithiasis.  No  complications of acute pancreatitis.  Suspect a large diverticulum  arising from the rectosigmoid colon.   FINAL DIAGNOSES:  1. Acute pancreatitis.  2. Atypical chest pain related to her pancreatitis.  3. Hyperlipidemia.  4. Dementia.  5. 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   DISCHARGE MEDICATIONS:  1. Aricept 10 mg p.o. daily.  2. Norvasc 2.5 mg daily.  3. Citalopram 40 mg daily.  4. Nexium 40 mg daily.   PLAN:  The patient advised to follow with her primary care physician Sharon [name], D.O. within a week's time to evaluate her giant  rectosigmoid diverticular through her primary care physician.  To repeat  a chest x-ray within a month's time to reassess her right upper lobe  nodule.  The patient is to be on low-fat cardiac healthy diet.  The plan  discussed with her son as requested by the patient.      Eduard Clos, MD  Electronically  Signed     ANK/MEDQ  D:  05/31/2008  T:  05/31/2008  Job:  [id number]   cc:   Sharon [name], D.O.  Fax: [phone]

## 2010-12-19 NOTE — Procedures (Signed)
DUPLEX DEEP VENOUS EXAM - LOWER EXTREMITY   INDICATION:  Right lower extremity pain.   HISTORY:  Edema:  No.  Trauma/Surgery:  No.  Pain:  Yes.  PE:  No.  Previous DVT:  No.  Anticoagulants:  Other:   DUPLEX EXAM:                CFV   SFV   PopV  Peroneal GSV                R  L  R  L  R  L  R    L   R  L  Thrombosis    o  o  o     o     +        +  Spontaneous   +  +  +     +     0        0  Phasic        +  +  +     +     0        0  Augmentation  +  +  +     +     0        0  Compressible  +  +  +     +     0        0  Competent   Legend:  + - yes  o - no  p - partial  D - decreased   IMPRESSION:  1. Totally occlusive subacute thrombus of the right peroneal vein      noted at the mid-to-distal calf level with mild vessel dilatation      noted.  2. Totally occlusive subacute thrombus noted within a varicose vein of      the right greater saphenous vein noted at the proximal calf level.   Preliminary report was called to Dr. Ophelia Charter at 11:50 on 02/17/08.    _____________________________  P. Liliane Bade, M.D.   CH/MEDQ  D:  02/17/2008  T:  02/17/2008  Job:  [id number]

## 2010-12-19 NOTE — H&P (Signed)
NAMECHANNELLE, BOTTGER              ACCOUNT NO.:  [id number]   MEDICAL RECORD NO.:  [id number]          PATIENT TYPE:  EMS   LOCATION:  ED                           [facility]AC[facility]   PHYSICIAN:  Thomasenia Bottoms, MDDATE OF BIRTH:  1933/03/19   DATE OF ADMISSION:  05/28/2008  DATE OF DISCHARGE:                              HISTORY & PHYSICAL   CHIEF COMPLAINT:  Chest pain, abdominal pain.   HISTORY OF PRESENT ILLNESS:  Ms. Wengert is a 77 year old who presents  with midsternal chest pain and abdominal soreness.  She says the  discomfort started yesterday afternoon after she got home from her  doctor's appointment.  She says she saw the doctor because she had not  been feeling well during the week.  She felt sluggish.  She was  nauseated, and just did not feel well, but she did not have any discrete  pain until she got home.  She denies any shortness of breath.  Her chest  pain started yesterday, and it has been intermittent, and today it  really worried her.  She says she lives alone, and she was scared to go  to sleep because of the chest pain.  She wanted to be sure she was not  going to have a heart attack and not wake up, so she eventually came in  to be evaluated.  Her past medical history is significant for emphysema  dementia, dyslipidemia.  She has a stable right upper lobe lung nodule  which was certainly noted on a CT in 2006 and it was stable per CT last  month in 04/2008.  She has a history of renal insufficiency.  She tells  me she has had an MI in the past.  She has a history of hiatal hernia  and has required surgery for the bilateral hernia, and I also note in  her record she has had five CT scans of her chest in the last 3 years.  The patient was last hospitalized in September 2008, and at that time  she was felt to have noncardiac chest pain.   MEDICATIONS ON ARRIVAL:  1. Aricept 10 mg p.o. daily.  2. Simvastatin 20 mg daily.  3. Citalopram 40 mg daily.  4.  Nexium 40 mg daily.   FAMILY HISTORY:  Noncontributory.   SOCIAL HISTORY:  She does not smoke cigarettes, drink alcohol or use any  illicit drugs.   REVIEW OF SYSTEMS:  Constitutional:  As mentioned above.  She has not  felt well all week.  Vague symptoms of having no energy and in no  appetite.  HEENT:  No headache or sore throat.  CARDIOVASCULAR:  She did have some  midsternal chest pain.  No lower extremity edema.  RESPIRATORY:  No  shortness of breath or hemoptysis.  GI:  She has had this abdominal  discomfort and nausea.  No diarrhea.  No actual vomiting.  All other  systems reviewed and are negative.  In the emergency department, her  temperature on arrival was 97.3, blood pressure 149/78, pulse 82,  respiratory rate 24, O2 sats 100% on room  air.   PHYSICAL EXAMINATION:  GENERAL:  The patient is elderly woman in no  acute distress.  She is well-nourished, well-developed.  HEENT:  Normocephalic, atraumatic.  Pupils are equal and round.  She has  arcus senilis.  Nonicteric sclera.  Oral mucosa moist.  NECK:  Supple.  No lymphadenopathy, no thyromegaly, no jugular venous  distention.  CARDIAC:  Regular rate and rhythm.  She has a faint systolic murmur.  LUNGS:  Clear to auscultation bilaterally.  No wheezes or rhonchi.  No  rales.  ABDOMEN:  Soft.  She does have mild to moderate tenderness in her mid  abdominal area and epigastric area.  No rebound or guarding.  No  hepatosplenomegaly, but she is significantly tender.  EXTREMITIES:  Reveal no evidence of clubbing, cyanosis or pitting edema.  NEUROLOGICAL:  She is alert and oriented.  She is attentive and  appropriate.  Cranial nerves II-XII are intact grossly.  She is oriented  x3.  She has 5/5 strength in her upper and lower extremities.  His  sensory exam is intact grossly in upper and lower extremities.  She has  normal muscle tone and bulk.  MUSCULOSKELETAL:  Examination reveals no evidence of effusion of her  joints,  fairly good range of motion.  SKIN:  Intact.  No lesions.  No rashes.   LABORATORY DATA:  The patient's white count of 6.3, hemoglobin 13.5,  hematocrit 41.0, platelet count is 157.  Her troponin is less than 0.05.  Her sodium is 138, potassium 3.5 chloride 106, bicarb 21, glucose 192,  BUN 70, creatinine 1.51, AST 30, ALT 11, albumin is normal at 3.6,  lipase is minimally elevated 94.  She did have a chest x-ray also that  reveals stable COPD changes.  No acute cardiopulmonary disease, and also  right upper lobe pulmonary nodule which she is known to have.   ASSESSMENT AND PLAN:  Chest pain and abdominal soreness.  The patient  did receive nitroglycerin which helped her pain.  However, even though  her lipase is only minimally elevated, her abdominal pain on exam is  consistent with pancreatitis, and the patient also reports nausea and  diminished appetite for several days.  This patient has had several CTs  over the years.  I am going to start with an abdominal ultrasound.  Will  check a fasting lipid panel and reevaluate her pain in the morning.  If  she is still hurting at that point, we will get a CT scan.  Will make  her n.p.o.  Put her on IV fluids.  Provide pain medication.  I will put  her on IV b.i.d. proton pump inhibitor in case she has significant  gastritis.  I will also rule her out for MI, but cardiac pain is not  high on the differential given her other findings.  For her dementia,  will continue her Aricept.  For depression, will continue citalopram.  For her history of dyslipidemia, will continue her simvastatin.      Thomasenia Bottoms, MD  Electronically Signed     CVC/MEDQ  D:  05/28/2008  T:  05/28/2008  Job:  [id number]   cc:   Lovenia Kim, D.O.  Fax: [phone]

## 2010-12-22 NOTE — Assessment & Plan Note (Signed)
[facility]                               PULMONARY OFFICE NOTE   GIAVONNA, PFLUM                       MRN:          [id number]  DATE:02/25/2006                            DOB:          1932-11-06    PRIMARY CARE PHYSICIAN:  Dr. Marisue Brooklyn.   GENERAL SURGEON:  Dr. Avel Peace.   CARDIOLOGIST:  Dr. Myrtis Ser.   GASTROENTEROLOGIST:  Dr. Yancey Flemings.   PROBLEMS:  1.  Right upper lobe nodule.  2.  Emphysema by CT scan.  3.  Dyspnea.  4.  Coronary disease.   HISTORY:  She describes grabbing pains in the left medial scapula area,  which she does not associate with using or reaching her arm or shoulder, or  bending her neck.  She is pending endoscopy in early August.  She has no  recognized exertional chest pain, palpitations, significant cough, wheeze or  other acute issues.   MEDICATIONS:  Aricept 10 mg.   ALLERGIES:  No medication allergy.   OBJECTIVE:  VITAL SIGNS:  Weight 164 pounds, BP 130/80, pulse regular 66,  room air saturation 99%.  GENERAL:  She is in no obvious distress.  CHEST:  Lung fields are quiet and clear.  HEART:  Sounds regular without murmur or gallop.  I do not find edema,  cyanosis, neck vein distention, clubbing or adenopathy.   RADIOLOGY:  1.  A CT scan January 25, 2006, described a stable small right upper lobe      pulmonary nodule, most consistent with a benign etiology.  Recommended      followup in a year to complete a 2-year observation.  2.  Enlarged right thyroid nodule.  Recommending a thyroid ultrasound.  3.  Emphysema.  4.  Hiatal hernia.   The CT report was sent to Dr. Elisabeth Most for her followup of thyroid with  thyroid ultrasound as appropriate.   IMPRESSION:  1.  Lung nodule appears stable, for 1-year followup.  2.  Dyspnea without change, mainly reflecting deconditioning and heat, with      note that in November 2006 pulmonary function tests showed normal lung      volumes, normal spirometry  flows without response to bronchodilator      (borderline low small airway flows) and a moderately diffuse diffusion      of 64% of predicted.   PLAN:  1.  She is encouraged to walk for endurance and reconditioning.  2.  Schedule return in 1 year to follow up on her right upper lobe nodule,      earlier p.r.n.                                   Clinton D. Maple Hudson, MD, FCCP, FACP   CDY/MedQ  DD:  02/28/2006  DT:  03/01/2006  Job #:  [id number]

## 2010-12-22 NOTE — Assessment & Plan Note (Signed)
[facility]                            CARDIOLOGY OFFICE NOTE   Sharon [name], Sharon [name]                       MRN:          [id number]  DATE:11/29/2006                            DOB:          [birth year]    Sharon [name] is seen for cardiology followup.  When I saw her in May 2007  she had an adenosine Myoview scan.  There was no significant scar or  ischemia.  She had good ejection fraction.   She says that she had a heart attack in the past but we have never  approved this.   Currently she has continuous pain along her sternal area.  This may be a  combination of GI and musculoskeletal symptoms.  This is not a cardiac  ischemia.   PAST MEDICAL HISTORY:  No known drug allergies.   MEDICATIONS:  Aricept, simvastatin and Prevacid.   OTHER MEDICAL PROBLEMS:  See the list below.   REVIEW OF SYSTEMS:  Her major complaint is inability to eat and this  unusual sensation in her anterior chest.  Otherwise her review of  systems is negative.   PHYSICAL EXAMINATION:  Blood pressure is 135/79 with a pulse of 68.  The patient is oriented to person, time and place.  Affect reveals that  she is uncomfortable with this discomfort in her chest.  She has no xanthelasma.  There is normal extraocular motion.  She has no carotid bruits.  There is no jugular venous distention.  CARDIAC:  Exam reveals an S1 with an S2.  There are no clicks or  significant murmurs.  LUNGS:  Are clear. She has no respiratory distress.  ABDOMEN:  Is soft, there are no masses or bruits.  She has normal bowel  sounds.  There is no peripheral edema.   EKG today reveals sinus rhythm.   PROBLEMS:  Include:  1. History of lung nodule that is followed.  2. History of emphysema.  3. History of functional abdominal pain.  4. History per the patient of an myocardial infarction in the 80s but      no proof of this.  5. Chest discomfort, currently not cardiac.  6. Normal ejection fraction and  no ischemia by Cardiolite last year.   I feel that further cardiac workup would not be recommended at this  time.  I believe her pain is not cardiac in origin.     Luis Abed, MD, [facility]  Electronically Signed    JDK/MedQ  DD: 11/29/2006  DT: 11/29/2006  Job #: [id number]   cc:   Lovenia Kim, D.O.

## 2011-05-07 LAB — DIFFERENTIAL
Eosinophils Absolute: 0.1
Eosinophils Absolute: 0.1
Eosinophils Relative: 1
Lymphocytes Relative: 39
Lymphocytes Relative: 46
Lymphs Abs: 2
Lymphs Abs: 2.9
Monocytes Relative: 6
Monocytes Relative: 8
Neutro Abs: 2.7
Neutrophils Relative %: 46
Neutrophils Relative %: 52

## 2011-05-07 LAB — CBC
HCT: 33.4 — ABNORMAL LOW
HCT: 37.3
Hemoglobin: 11.4 — ABNORMAL LOW
Hemoglobin: 12.4
Hemoglobin: 13.4
MCHC: 32.9
MCHC: 33.1
MCHC: 33.2
MCHC: 34.1
MCV: 88.6
MCV: 89
MCV: 89.2
MCV: 89.4
Platelets: 124 — ABNORMAL LOW
RBC: 3.77 — ABNORMAL LOW
RBC: 4.17
RBC: 4.55
RDW: 14.4
RDW: 14.6
RDW: 14.7
WBC: 4.5

## 2011-05-07 LAB — POCT CARDIAC MARKERS
CKMB, poc: <1 — ABNORMAL LOW
Troponin i, poc: <0.05

## 2011-05-07 LAB — COMPREHENSIVE METABOLIC PANEL
ALT: 10
ALT: 11
AST: 13
AST: 30
Albumin: 3.1 — ABNORMAL LOW
BUN: 7
CO2: 26
Calcium: 8.5
Calcium: 8.6
Calcium: 8.8
Chloride: 110
Creatinine, Ser: 0.94
Creatinine, Ser: 0.96
Creatinine, Ser: 1.51 — ABNORMAL HIGH
GFR calc Af Amer: 41 — ABNORMAL LOW
GFR calc Af Amer: >60
GFR calc non Af Amer: 57 — ABNORMAL LOW
Glucose, Bld: 87
Sodium: 138
Sodium: 141
Sodium: 141
Total Protein: 6.1
Total Protein: 6.3

## 2011-05-07 LAB — BASIC METABOLIC PANEL
BUN: 5 — ABNORMAL LOW
CO2: 23
CO2: 25
Calcium: 8.4
Chloride: 114 — ABNORMAL HIGH
Chloride: 115 — ABNORMAL HIGH
Creatinine, Ser: 0.87
GFR calc Af Amer: >60
GFR calc Af Amer: >60
GFR calc non Af Amer: >60
Glucose, Bld: 105 — ABNORMAL HIGH
Glucose, Bld: 118 — ABNORMAL HIGH
Potassium: 3.9
Sodium: 142
Sodium: 144

## 2011-05-07 LAB — TROPONIN I
Troponin I: 0.01
Troponin I: 0.01

## 2011-05-07 LAB — LIPASE, BLOOD
Lipase: 75 — ABNORMAL HIGH
Lipase: 76 — ABNORMAL HIGH
Lipase: 94 — ABNORMAL HIGH

## 2011-05-07 LAB — LIPID PANEL
Cholesterol: 157
HDL: 47
Triglycerides: 78

## 2011-05-07 LAB — D-DIMER, QUANTITATIVE: D-Dimer, Quant: 0.33

## 2011-05-07 LAB — CK TOTAL AND CKMB (NOT AT ARMC)
CK, MB: 0.8
CK, MB: 0.8
Relative Index: INVALID
Relative Index: INVALID
Relative Index: INVALID
Total CK: 53

## 2011-05-07 LAB — VITAMIN B12: Vitamin B-12: 262 (ref 211–911)

## 2011-05-07 LAB — TSH: TSH: 1.25

## 2011-05-17 LAB — TSH: TSH: 0.498

## 2011-05-17 LAB — COMPREHENSIVE METABOLIC PANEL
ALT: 13
AST: 16
Alkaline Phosphatase: 85
CO2: 23
Chloride: 111
Creatinine, Ser: 0.93
GFR calc Af Amer: >60
GFR calc non Af Amer: 59 — ABNORMAL LOW
Potassium: 3.8
Sodium: 143
Total Bilirubin: 0.6

## 2011-05-17 LAB — BASIC METABOLIC PANEL
BUN: 4 — ABNORMAL LOW
CO2: 27
Calcium: 8.7
Calcium: 8.8
Calcium: 9
Creatinine, Ser: 1.02
Creatinine, Ser: 1.1
GFR calc Af Amer: 59 — ABNORMAL LOW
GFR calc Af Amer: >60
GFR calc Af Amer: >60
GFR calc non Af Amer: 49 — ABNORMAL LOW
GFR calc non Af Amer: 53 — ABNORMAL LOW
GFR calc non Af Amer: 57 — ABNORMAL LOW
Sodium: 137

## 2011-05-17 LAB — BLOOD GAS, ARTERIAL
Bicarbonate: 26.6 — ABNORMAL HIGH
O2 Saturation: 96.8
pH, Arterial: 7.398
pO2, Arterial: 79.4 — ABNORMAL LOW

## 2011-05-17 LAB — POCT CARDIAC MARKERS
Myoglobin, poc: 92.6
Operator id: 1192

## 2011-05-17 LAB — CBC
HCT: 40.2
Hemoglobin: 13.4
Hemoglobin: 13.6
MCHC: 34.2
MCV: 86
Platelets: 173
RBC: 4.17
RBC: 4.7
RDW: 13.7
WBC: 5.8
WBC: 7.5

## 2011-05-17 LAB — TROPONIN I: Troponin I: 0.01

## 2011-05-17 LAB — LIPID PANEL
Cholesterol: 176
HDL: 39 — ABNORMAL LOW
LDL Cholesterol: 120 — ABNORMAL HIGH

## 2011-05-17 LAB — CK TOTAL AND CKMB (NOT AT ARMC)
CK, MB: 1.3
Relative Index: INVALID

## 2011-08-20 ENCOUNTER — Encounter: Payer: Self-pay | Admitting: Internal Medicine

## 2012-05-09 ENCOUNTER — Encounter: Payer: Self-pay | Admitting: Internal Medicine

## 2012-11-27 ENCOUNTER — Encounter: Payer: Self-pay | Admitting: Adult Health

## 2012-11-27 ENCOUNTER — Non-Acute Institutional Stay (SKILLED_NURSING_FACILITY): Payer: Medicare Other | Admitting: Adult Health

## 2012-11-27 DIAGNOSIS — E785 Hyperlipidemia, unspecified: Secondary | ICD-10-CM

## 2012-11-27 DIAGNOSIS — F0391 Unspecified dementia with behavioral disturbance: Secondary | ICD-10-CM

## 2012-11-27 DIAGNOSIS — I10 Essential (primary) hypertension: Secondary | ICD-10-CM

## 2012-11-27 DIAGNOSIS — F03918 Unspecified dementia, unspecified severity, with other behavioral disturbance: Secondary | ICD-10-CM

## 2012-11-27 DIAGNOSIS — K219 Gastro-esophageal reflux disease without esophagitis: Secondary | ICD-10-CM

## 2012-11-27 DIAGNOSIS — J189 Pneumonia, unspecified organism: Secondary | ICD-10-CM

## 2012-11-27 DIAGNOSIS — G8929 Other chronic pain: Secondary | ICD-10-CM | POA: Insufficient documentation

## 2012-11-27 MED ORDER — AMLODIPINE BESYLATE 10 MG PO TABS: 5.0000 mg | ORAL_TABLET | Freq: Every day | ORAL | Status: DC

## 2012-11-27 NOTE — Assessment & Plan Note (Signed)
She is not complaining of any pain present is taking lyrica 50 mg twice daily uses voltaren gel 2 gm to her left knee four times daily

## 2012-11-27 NOTE — Assessment & Plan Note (Signed)
Is stable is taking prilosec 40 mg daily

## 2012-11-27 NOTE — Assessment & Plan Note (Signed)
She is status post hospitalization is on avelox 400 mg daily for total of 3 days.

## 2012-11-27 NOTE — Assessment & Plan Note (Signed)
There are no reports of behavioral disturbance present is taking aricept 10 mg daily and namenda 5 mg twice daily is taking depakote 250 mg nightly to help stabilize mood.

## 2012-11-27 NOTE — Progress Notes (Signed)
Patient ID: Sharon [name], female   DOB: 1932-12-29, 79 y.o.   MRN: [id number]  Chief Complaint  Patient presents with  . Hospitalization Follow-up    HPI:   She had been living at home with family prior to her hospitalization for pneumonia; more than likely this does represent a long term placement for her.    Essential hypertension, benign Her blood pressure is elevated at 159/84. She is taking norvasc 2.5 mg daily   Pneumonia She is status post hospitalization is on avelox 400 mg daily for total of 3 days.   GERD Is stable is taking prilosec 40 mg daily   Dementia with behavioral disturbance There are no reports of behavioral disturbance present is taking aricept 10 mg daily and namenda 5 mg twice daily is taking depakote 250 mg nightly to help stabilize mood.   Chronic pain She is not complaining of any pain present is taking lyrica 50 mg twice daily uses voltaren gel 2 gm to her left knee four times daily   Other and unspecified hyperlipidemia She is presently stable she has not had any recent lab work done is taking zocor 20 mg daily    Past Medical History  Diagnosis Date  . Emphysema   . Lung nodule   . Diverticulosis of colon   . Dysphagia   . Peptic stricture of esophagus   . Acute myocardial infarction   . CAD (coronary artery disease)     History reviewed. No pertinent past surgical history.  VITAL SIGNS BP 159/84  Pulse 71  Ht 5\' 5"  (1.651 m)  Wt 155 lb (70.308 kg)  BMI 25.79 kg/m2   Patient's Medications  New Prescriptions   No medications on file  Previous Medications   AMLODIPINE (NORVASC) 10 MG TABLET    Take 2.5 mg by mouth daily.   CALCIUM-VITAMIN D (OSCAL WITH D) 500-200 MG-UNIT PER TABLET    Take 1 tablet by mouth daily.   DICLOFENAC SODIUM (VOLTAREN) 1 % GEL    Apply 2 g topically 4 (four) times daily. To right knee   DIVALPROEX (DEPAKOTE) 250 MG DR TABLET    Take 250 mg by mouth at bedtime.   DONEPEZIL (ARICEPT) 10 MG TABLET    Take  10 mg by mouth at bedtime.   MEMANTINE (NAMENDA) 5 MG TABLET    Take 5 mg by mouth 2 (two) times daily.   OMEPRAZOLE (PRILOSEC) 40 MG CAPSULE    Take 40 mg by mouth daily.   PREGABALIN (LYRICA) 50 MG CAPSULE    Take 50 mg by mouth 2 (two) times daily.   SIMVASTATIN (ZOCOR) 20 MG TABLET    Take 20 mg by mouth at bedtime.   ZOLPIDEM (AMBIEN) 5 MG TABLET    Take 5 mg by mouth at bedtime as needed for sleep.  Modified Medications   No medications on file  Discontinued Medications   No medications on file    SIGNIFICANT DIAGNOSTIC EXAMS   None recent    Review of Systems  Constitutional: Negative for malaise/fatigue.  Respiratory: Negative for cough and shortness of breath.   Cardiovascular: Negative for chest pain.  Gastrointestinal: Negative for heartburn, abdominal pain and constipation.  Musculoskeletal: Negative for myalgias and joint pain.  Neurological: Negative for headaches.  Psychiatric/Behavioral: Negative for depression. The patient does not have insomnia.      Physical Exam  Constitutional: She appears well-developed and well-nourished.  HENT:  Head: Normocephalic.  Neck: Normal range of motion. Neck supple.  Cardiovascular: Normal rate, regular rhythm and intact distal pulses.   Respiratory: Effort normal and breath sounds normal.  GI: Soft. Bowel sounds are normal.  Musculoskeletal: Normal range of motion. She exhibits no edema.  Neurological: She is alert.  Oriented to self only   Skin: Skin is warm and dry.  Psychiatric: She has a normal mood and affect.       ASSESSMENT/ PLAN:   Her blood pressure is elevated will increase her norvasc to 5 mg daily will have staff continue to monitor her blood pressure every shift; will check a cbc; cmp; lipids; depakote level and tsh upon the blood draw and will continue to monitor her status    Time spent with patient: 50 minutes

## 2012-11-27 NOTE — Assessment & Plan Note (Signed)
Her blood pressure is elevated at 159/84. She is taking norvasc 2.5 mg daily

## 2012-11-27 NOTE — Assessment & Plan Note (Signed)
She is presently stable she has not had any recent lab work done is taking zocor 20 mg daily

## 2012-12-01 ENCOUNTER — Non-Acute Institutional Stay (SKILLED_NURSING_FACILITY): Payer: Medicare Other | Admitting: Internal Medicine

## 2012-12-01 DIAGNOSIS — I10 Essential (primary) hypertension: Secondary | ICD-10-CM

## 2012-12-01 DIAGNOSIS — F0280 Dementia in other diseases classified elsewhere without behavioral disturbance: Secondary | ICD-10-CM

## 2012-12-01 DIAGNOSIS — K219 Gastro-esophageal reflux disease without esophagitis: Secondary | ICD-10-CM

## 2012-12-01 DIAGNOSIS — J189 Pneumonia, unspecified organism: Secondary | ICD-10-CM

## 2012-12-01 DIAGNOSIS — G309 Alzheimer's disease, unspecified: Secondary | ICD-10-CM

## 2012-12-16 DIAGNOSIS — I10 Essential (primary) hypertension: Secondary | ICD-10-CM | POA: Insufficient documentation

## 2012-12-16 NOTE — Progress Notes (Signed)
Patient ID: Sharon [name], female   DOB: 1933-05-26, 79 y.o.   MRN: [id number]        HISTORY & PHYSICAL  DATE: 12/01/2012   FACILITY: [facility]  LEVEL OF CARE: SNF (31)  ALLERGIES:   Tramadol.    CHIEF COMPLAINT:  Manage pneumonia, hypertension and dementia.    HISTORY OF PRESENT ILLNESS:  79 year-old, African-American female was hospitalized due to dry cough, shortness of breath, and worsening confusion.  Pulse ox was 94% room air.  Chest x-ray was concerning for a faint patchy opacity in the medial right lung base.  She was started on Avelox to which she responded.  She is discharged to this facility for short-term rehabilitation.   She denies further cough or shortness of breath.    DEMENTIA: At admission, patient was combative and confused.  Now, she is responsive to commands well.  The dementia remains stable and continues to function adequately in the current living environment with supervision.  The patient has had little changes in behavior. No complications noted from the medications presently being used.   HTN: Pt 's HTN remains stable.  Denies CP, sob, DOE, pedal edema, headaches, dizziness or visual disturbances.  No complications from the medications currently being used.  Last BP : 140/80, 159/84, 136/80.   PAST MEDICAL HISTORY :   Hypertension.    Dementia.    PAST SURGICAL HISTORY: None.    SOCIAL HISTORY: MARITAL HISTORY:  The patient is single.   TOBACCO USE:  She was a former smoker.  No longer smokes.   ALCOHOL:  Denies alcohol use.  ILLICIT DRUGS:  Denies illicit drug use.   FAMILY HISTORY:  MOTHER:  Mother had coronary artery disease.   CHILDREN:  Son has diabetes and a stroke.   CURRENT MEDICATIONS: Reviewed per [facility]  REVIEW OF SYSTEMS:  See HPI otherwise 14 point ROS is negative.  PHYSICAL EXAMINATION  VS:  T 97.7       P 78      RR 18       BP 136/80      POX%        WT (Lb)  GENERAL: no acute distress, normal body  habitus EYES: conjunctivae normal, sclerae normal, normal eye lids MOUTH/THROAT: lips without lesions,no lesions in the mouth,tongue is without lesions,uvula elevates in midline NECK: supple, trachea midline, no neck masses, no thyroid tenderness, no thyromegaly LYMPHATICS: no LAN in the neck, no supraclavicular LAN RESPIRATORY: breathing is even & unlabored, BS CTAB CARDIAC: RRR, no murmur,no extra heart sounds EDEMA/VARICOSITIES:  +1 bilateral edema.  ARTERIAL:  pedal pulses nonpalpable  GI:  ABDOMEN: abdomen soft, normal BS, no masses, no tenderness  LIVER/SPLEEN: no hepatomegaly, no splenomegaly MUSCULOSKELETAL: HEAD: normal to inspection & palpation BACK: no kyphosis, scoliosis or spinal processes tenderness EXTREMITIES: LEFT UPPER EXTREMITY: strength intact, range of motion moderate  RIGHT UPPER EXTREMITY: strength intact, range of motion moderate  LEFT LOWER EXTREMITY: strength intact, range of motion minimal  RIGHT LOWER EXTREMITY: strength intact, range of motion minimal  PSYCHIATRIC: the patient is alert & oriented to person, affect & behavior appropriate  LABS/RADIOLOGY: CBC normal.   BMP normal.   ASSESSMENT/PLAN:  Pneumonia.  Continue Avelox as prescribed.    Hypertension.  Blood pressures variable.  Norvasc was increased.     Dementia.  Stable.  TSH pending.    GERD.  Well controlled.    Hyperlipidemia.  Continue Zocor.    Check CBC, CMP  and Depakote level.    I have reviewed patient's medical records received at admission/from hospitalization.  CPT CODE: 16109

## 2012-12-22 ENCOUNTER — Non-Acute Institutional Stay (SKILLED_NURSING_FACILITY): Payer: Medicare Other | Admitting: Internal Medicine

## 2012-12-22 DIAGNOSIS — I10 Essential (primary) hypertension: Secondary | ICD-10-CM

## 2012-12-22 DIAGNOSIS — K219 Gastro-esophageal reflux disease without esophagitis: Secondary | ICD-10-CM

## 2012-12-22 DIAGNOSIS — F0390 Unspecified dementia without behavioral disturbance: Secondary | ICD-10-CM

## 2012-12-22 DIAGNOSIS — G609 Hereditary and idiopathic neuropathy, unspecified: Secondary | ICD-10-CM | POA: Insufficient documentation

## 2012-12-22 NOTE — Progress Notes (Signed)
PROGRESS NOTE  DATE: 12/22/2012  FACILITY: Nursing Home Location: [facility]  LEVEL OF CARE: SNF (31)  Routine Visit  CHIEF COMPLAINT:  Manage hypertension, dementia and peripheral neuropathy  HISTORY OF PRESENT ILLNESS:  REASSESSMENT OF ONGOING PROBLEM(S):  1. HTN: Pt 's HTN remains stable. Denies CP, sob, DOE, pedal edema, headaches, dizziness or visual disturbances.  No complications from the medications currently being used.  Last BP : 118/78.  2. DEMENTIA: The dementia remaines stable and continues to function adequately in the current living environment with supervision.  The patient has had little changes in behavior. No complications noted from the medications presently being used.  3.PERIPHERAL NEUROPATHY: The peripheral neuropathy is stable. The patient denies pain in the feet, tingling, and numbness. No complications noted from the medication presently being used.  PAST MEDICAL HISTORY : Reviewed.  No changes.  CURRENT MEDICATIONS: Reviewed per [facility]  REVIEW OF SYSTEMS:  GENERAL: no change in appetite, no fatigue, no weight changes, no fever, chills or weakness RESPIRATORY: no cough, SOB, DOE, wheezing, hemoptysis CARDIAC: no chest pain, edema or palpitations GI: no abdominal pain, diarrhea, constipation, heart burn, nausea or vomiting  PHYSICAL EXAMINATION  VS:  T 97.6       P 79      RR 18      BP 118/78     POX %     WT (Lb) 153  GENERAL: no acute distress, normal body habitus EYES: conjunctivae normal, sclerae normal, normal eye lids NECK: supple, trachea midline, no neck masses, no thyroid tenderness, no thyromegaly LYMPHATICS: no LAN in the neck, no supraclavicular LAN RESPIRATORY: breathing is even & unlabored, BS CTAB CARDIAC: RRR, no murmur,no extra heart sounds, +1 bilateral lower extremity edema GI: abdomen soft, normal BS, no masses, no tenderness, no hepatomegaly, no splenomegaly PSYCHIATRIC: the patient is alert & oriented to  person, affect & behavior appropriate  LABS/RADIOLOGY:  4/14 CBC normal, creatinine 1.08, total protein 5.9 otherwise CMP normal, FLP normal, TSH 0.977, Depakote level 50  ASSESSMENT/PLAN:  1. Hypertension-well-controlled. 2. peripheral neuropathy-continue Lyrica. 3. dementia-stable. 4. GERD-stable. 5. hyperlipidemia-DC Zocor 2 to age and dementia. 6. right knee pain-continue voltaren gel. 7. acute renal failure-Monitor.  CPT CODE: 16109

## 2012-12-30 ENCOUNTER — Other Ambulatory Visit: Payer: Self-pay | Admitting: Geriatric Medicine

## 2012-12-30 MED ORDER — PREGABALIN 50 MG PO CAPS: 50.0000 mg | ORAL_CAPSULE | Freq: Two times a day (BID) | ORAL | Status: DC

## 2013-01-12 ENCOUNTER — Non-Acute Institutional Stay (SKILLED_NURSING_FACILITY): Payer: Medicare Other | Admitting: Internal Medicine

## 2013-01-12 DIAGNOSIS — G609 Hereditary and idiopathic neuropathy, unspecified: Secondary | ICD-10-CM

## 2013-01-12 DIAGNOSIS — F0280 Dementia in other diseases classified elsewhere without behavioral disturbance: Secondary | ICD-10-CM

## 2013-01-12 DIAGNOSIS — I10 Essential (primary) hypertension: Secondary | ICD-10-CM

## 2013-01-12 DIAGNOSIS — K219 Gastro-esophageal reflux disease without esophagitis: Secondary | ICD-10-CM

## 2013-01-15 NOTE — Progress Notes (Signed)
PROGRESS NOTE  DATE: 01-12-13  FACILITY: Nursing Home Location: [facility]  LEVEL OF CARE: SNF (31)  Routine Visit  CHIEF COMPLAINT:  Manage hypertension, dementia and peripheral neuropathy  HISTORY OF PRESENT ILLNESS:  REASSESSMENT OF ONGOING PROBLEM(S):  1. HTN: Pt 's HTN remains stable. Denies CP, sob, DOE, pedal edema, headaches, dizziness or visual disturbances.  No complications from the medications currently being used.  Last BP : 130/78, 118/78.  2. DEMENTIA: The dementia remaines stable and continues to function adequately in the current living environment with supervision.  The patient has had little changes in behavior. No complications noted from the medications presently being used.  3.PERIPHERAL NEUROPATHY: The peripheral neuropathy is stable. The patient denies pain in the feet, tingling, and numbness. No complications noted from the medication presently being used.  PAST MEDICAL HISTORY : Reviewed.  No changes.  CURRENT MEDICATIONS: Reviewed per [facility]  REVIEW OF SYSTEMS:  GENERAL: no change in appetite, no fatigue, no weight changes, no fever, chills or weakness RESPIRATORY: no cough, SOB, DOE, wheezing, hemoptysis CARDIAC: no chest pain, edema or palpitations GI: no abdominal pain, diarrhea, constipation, heart burn, nausea or vomiting  PHYSICAL EXAMINATION  VS:  T 96.2       P 70      RR 18      BP 130/78     POX %     WT (Lb) 161  GENERAL: no acute distress, normal body habitus NECK: supple, trachea midline, no neck masses, no thyroid tenderness, no thyromegaly RESPIRATORY: breathing is even & unlabored, BS CTAB CARDIAC: RRR, no murmur,no extra heart sounds, +1 bilateral lower extremity edema GI: abdomen soft, normal BS, no masses, no tenderness, no hepatomegaly, no splenomegaly PSYCHIATRIC: the patient is alert & oriented to person, affect & behavior appropriate  LABS/RADIOLOGY:  4/14 CBC normal, creatinine 1.08, total protein 5.9  otherwise CMP normal, FLP normal, TSH 0.977, Depakote level 50  ASSESSMENT/PLAN:  1. Hypertension-well-controlled. 2. peripheral neuropathy-continue Lyrica. 3. dementia-stable. 4. GERD-stable. 5. right knee pain-continue voltaren gel. 6. acute renal failure-Monitor.  CPT CODE: 16109

## 2013-02-11 ENCOUNTER — Other Ambulatory Visit: Payer: Self-pay | Admitting: Geriatric Medicine

## 2013-02-11 MED ORDER — HYDROCODONE-ACETAMINOPHEN 5-325 MG PO TABS: ORAL_TABLET | ORAL | Status: DC

## 2013-02-25 ENCOUNTER — Non-Acute Institutional Stay (SKILLED_NURSING_FACILITY): Payer: Medicare Other | Admitting: Internal Medicine

## 2013-02-25 DIAGNOSIS — I10 Essential (primary) hypertension: Secondary | ICD-10-CM

## 2013-02-25 DIAGNOSIS — K219 Gastro-esophageal reflux disease without esophagitis: Secondary | ICD-10-CM

## 2013-02-25 DIAGNOSIS — G609 Hereditary and idiopathic neuropathy, unspecified: Secondary | ICD-10-CM

## 2013-02-25 DIAGNOSIS — F0280 Dementia in other diseases classified elsewhere without behavioral disturbance: Secondary | ICD-10-CM

## 2013-02-25 NOTE — Progress Notes (Signed)
PROGRESS NOTE  DATE: 02-25-13  FACILITY: Nursing Home Location: [facility]  LEVEL OF CARE: SNF (31)  Routine Visit  CHIEF COMPLAINT:  Manage GERD, hypertension, dementia and peripheral neuropathy  HISTORY OF PRESENT ILLNESS:  REASSESSMENT OF ONGOING PROBLEM(S):  HTN: Pt 's HTN remains stable. Denies CP, sob, DOE, pedal edema, headaches, dizziness or visual disturbances.  No complications from the medications currently being used.  Last BP : 130/78, 118/78, 128/88.  DEMENTIA: The dementia remaines stable and continues to function adequately in the current living environment with supervision.  The patient has had little changes in behavior. No complications noted from the medications presently being used.  PERIPHERAL NEUROPATHY: The peripheral neuropathy is stable. The patient denies pain in the feet, tingling, and numbness. No complications noted from the medication presently being used.  GERD: pt's GERD is stable.  Complains of heartburn, but denies abd. Pain, nausea or vomiting.  Currently not on a PPI.  PAST MEDICAL HISTORY : Reviewed.  No changes.  CURRENT MEDICATIONS: Reviewed per [facility]  REVIEW OF SYSTEMS:  GENERAL: no change in appetite, no fatigue, no weight changes, no fever, chills or weakness RESPIRATORY: no cough, SOB, DOE, wheezing, hemoptysis CARDIAC: no chest pain, edema or palpitations GI: no abdominal pain, diarrhea, constipation, nausea or vomiting.  Positive heartburn.  PHYSICAL EXAMINATION  VS:  T 97.9       P 86      RR 19      BP 128/88     POX %     WT (Lb) 161  GENERAL: no acute distress, normal body habitus EYES: Normal sclerae, normal conjunctivae, no discharge NECK: supple, trachea midline, no neck masses, no thyroid tenderness, no thyromegaly LYMPHATICS: No cervical lymphadenopathy, no supraclavicular lymphadenopathy in the RESPIRATORY: breathing is even & unlabored, BS CTAB CARDIAC: RRR, no murmur,no extra heart sounds, +1  bilateral lower extremity edema GI: abdomen soft, normal BS, no masses, no tenderness, no hepatomegaly, no splenomegaly PSYCHIATRIC: the patient is alert & oriented to person, affect & behavior appropriate MUSCULOSKELETAL: Right knee and give that S., tender but no erythema or warmth  LABS/RADIOLOGY:  4/14 CBC normal, creatinine 1.08, total protein 5.9 otherwise CMP normal, FLP normal, TSH 0.977, Depakote level 50  ASSESSMENT/PLAN:  Hypertension-well-controlled. peripheral neuropathy-continue Lyrica. dementia-stable. GERD-new problem. Start Prilosec 20 mg daily. right knee edema-continue voltaren gel. Request PT evaluation. acute renal failure-Monitor.  CPT CODE: 16109

## 2013-03-28 ENCOUNTER — Inpatient Hospital Stay (HOSPITAL_COMMUNITY)
Admission: EM | Admit: 2013-03-28 | Discharge: 2013-03-31 | DRG: 282 | Disposition: A | Discharge status: Home / Self Care | Payer: Medicare Other | Attending: Cardiovascular Disease | Admitting: Cardiovascular Disease

## 2013-03-28 ENCOUNTER — Encounter (HOSPITAL_COMMUNITY): Payer: Self-pay | Admitting: Emergency Medicine

## 2013-03-28 ENCOUNTER — Emergency Department (HOSPITAL_COMMUNITY): Payer: Medicare Other

## 2013-03-28 ENCOUNTER — Other Ambulatory Visit: Payer: Self-pay | Admitting: Cardiovascular Disease

## 2013-03-28 DIAGNOSIS — G309 Alzheimer's disease, unspecified: Secondary | ICD-10-CM | POA: Diagnosis present

## 2013-03-28 DIAGNOSIS — I214 Non-ST elevation (NSTEMI) myocardial infarction: Principal | ICD-10-CM | POA: Diagnosis present

## 2013-03-28 DIAGNOSIS — I10 Essential (primary) hypertension: Secondary | ICD-10-CM | POA: Diagnosis present

## 2013-03-28 DIAGNOSIS — F0390 Unspecified dementia without behavioral disturbance: Secondary | ICD-10-CM

## 2013-03-28 DIAGNOSIS — E876 Hypokalemia: Secondary | ICD-10-CM | POA: Diagnosis not present

## 2013-03-28 DIAGNOSIS — F0391 Unspecified dementia with behavioral disturbance: Secondary | ICD-10-CM

## 2013-03-28 DIAGNOSIS — E785 Hyperlipidemia, unspecified: Secondary | ICD-10-CM | POA: Diagnosis present

## 2013-03-28 DIAGNOSIS — F0280 Dementia in other diseases classified elsewhere without behavioral disturbance: Secondary | ICD-10-CM | POA: Diagnosis present

## 2013-03-28 DIAGNOSIS — I252 Old myocardial infarction: Secondary | ICD-10-CM

## 2013-03-28 DIAGNOSIS — I959 Hypotension, unspecified: Secondary | ICD-10-CM | POA: Diagnosis present

## 2013-03-28 DIAGNOSIS — G8929 Other chronic pain: Secondary | ICD-10-CM | POA: Diagnosis present

## 2013-03-28 DIAGNOSIS — K219 Gastro-esophageal reflux disease without esophagitis: Secondary | ICD-10-CM | POA: Diagnosis present

## 2013-03-28 DIAGNOSIS — Z87891 Personal history of nicotine dependence: Secondary | ICD-10-CM

## 2013-03-28 DIAGNOSIS — Z79899 Other long term (current) drug therapy: Secondary | ICD-10-CM

## 2013-03-28 LAB — COMPREHENSIVE METABOLIC PANEL
ALT: 63 U/L — ABNORMAL HIGH (ref 0–35)
AST: 74 U/L — ABNORMAL HIGH (ref 0–37)
Albumin: 3.4 g/dL — ABNORMAL LOW (ref 3.5–5.2)
Alkaline Phosphatase: 91 U/L (ref 39–117)
BUN: 14 mg/dL (ref 6–23)
Chloride: 109 mEq/L (ref 96–112)
Potassium: 3.9 mEq/L (ref 3.5–5.1)
Total Bilirubin: 0.4 mg/dL (ref 0.3–1.2)

## 2013-03-28 LAB — CBC WITH DIFFERENTIAL/PLATELET
Basophils Relative: 0 % (ref 0–1)
Hemoglobin: 13.5 g/dL (ref 12.0–15.0)
MCHC: 35.3 g/dL (ref 30.0–36.0)
Monocytes Relative: 9 % (ref 3–12)
Neutro Abs: 7.3 10*3/uL (ref 1.7–7.7)
Neutrophils Relative %: 78 % — ABNORMAL HIGH (ref 43–77)
RBC: 4.48 MIL/uL (ref 3.87–5.11)

## 2013-03-28 LAB — MRSA PCR SCREENING: MRSA by PCR: NEGATIVE

## 2013-03-28 LAB — CG4 I-STAT (LACTIC ACID): Lactic Acid, Venous: 1.68 mmol/L (ref 0.5–2.2)

## 2013-03-28 LAB — URINALYSIS, ROUTINE W REFLEX MICROSCOPIC
Bilirubin Urine: NEGATIVE
Glucose, UA: NEGATIVE mg/dL
Specific Gravity, Urine: 1.012 (ref 1.005–1.030)
pH: 6 (ref 5.0–8.0)

## 2013-03-28 LAB — LIPID PANEL
HDL: 67 mg/dL (ref >39)
LDL Cholesterol: 128 mg/dL — ABNORMAL HIGH (ref 0–99)

## 2013-03-28 LAB — TROPONIN I: Troponin I: 1.11 ng/mL (ref <0.30)

## 2013-03-28 LAB — PRO B NATRIURETIC PEPTIDE: Pro B Natriuretic peptide (BNP): 129.6 pg/mL (ref 0–450)

## 2013-03-28 MED ORDER — PREGABALIN 25 MG PO CAPS
50.0000 mg | ORAL_CAPSULE | Freq: Two times a day (BID) | ORAL | Status: DC
  Administered 2013-03-28 – 2013-03-31 (×6): 50 mg via ORAL
  Filled 2013-03-28 (×4): qty 2
  Filled 2013-03-28: qty 1
  Filled 2013-03-28: qty 2
  Filled 2013-03-28: qty 1

## 2013-03-28 MED ORDER — ASPIRIN EC 325 MG PO TBEC
325.0000 mg | DELAYED_RELEASE_TABLET | Freq: Every day | ORAL | Status: DC
  Administered 2013-03-29: 325 mg via ORAL
  Filled 2013-03-28 (×2): qty 1

## 2013-03-28 MED ORDER — ACETAMINOPHEN 325 MG PO TABS
650.0000 mg | ORAL_TABLET | Freq: Four times a day (QID) | ORAL | Status: DC | PRN
Start: 2013-03-28 — End: 2013-03-31
  Administered 2013-03-28 – 2013-03-31 (×2): 650 mg via ORAL
  Filled 2013-03-28: qty 2
  Filled 2013-03-28: qty 1
  Filled 2013-03-28: qty 2

## 2013-03-28 MED ORDER — NITROGLYCERIN IN D5W 200-5 MCG/ML-% IV SOLN
3.0000 ug/min | INTRAVENOUS | Status: DC
  Administered 2013-03-28: 3 ug/min via INTRAVENOUS
  Filled 2013-03-28: qty 250

## 2013-03-28 MED ORDER — METOPROLOL SUCCINATE ER 25 MG PO TB24
25.0000 mg | ORAL_TABLET | Freq: Every day | ORAL | Status: DC
  Filled 2013-03-28 (×2): qty 1

## 2013-03-28 MED ORDER — SODIUM CHLORIDE 0.9 % IV BOLUS (SEPSIS)
500.0000 mL | Freq: Once | INTRAVENOUS | Status: AC
  Administered 2013-03-28: 500 mL via INTRAVENOUS

## 2013-03-28 MED ORDER — DIVALPROEX SODIUM 250 MG PO DR TAB
250.0000 mg | DELAYED_RELEASE_TABLET | Freq: Every day | ORAL | Status: DC
  Administered 2013-03-28 – 2013-03-30 (×3): 250 mg via ORAL
  Filled 2013-03-28 (×6): qty 1

## 2013-03-28 MED ORDER — NITROGLYCERIN 0.4 MG SL SUBL
0.4000 mg | SUBLINGUAL_TABLET | SUBLINGUAL | Status: AC | PRN
Start: 2013-03-28 — End: 2013-03-30
  Administered 2013-03-28 – 2013-03-30 (×3): 0.4 mg via SUBLINGUAL
  Filled 2013-03-28 (×2): qty 25

## 2013-03-28 MED ORDER — CALCIUM CARBONATE-VITAMIN D 500-200 MG-UNIT PO TABS
1.0000 | ORAL_TABLET | Freq: Every day | ORAL | Status: DC
  Administered 2013-03-29 – 2013-03-31 (×2): 1 via ORAL
  Filled 2013-03-28 (×5): qty 1

## 2013-03-28 MED ORDER — PANTOPRAZOLE SODIUM 40 MG PO TBEC
40.0000 mg | DELAYED_RELEASE_TABLET | Freq: Every day | ORAL | Status: DC
  Administered 2013-03-29 – 2013-03-31 (×3): 40 mg via ORAL
  Filled 2013-03-28 (×3): qty 1

## 2013-03-28 MED ORDER — NITROGLYCERIN IN D5W 200-5 MCG/ML-% IV SOLN: 2.0000 ug/min | INTRAVENOUS | Status: DC

## 2013-03-28 MED ORDER — HEPARIN (PORCINE) IN NACL 100-0.45 UNIT/ML-% IJ SOLN
850.0000 [IU]/h | INTRAMUSCULAR | Status: DC
  Administered 2013-03-28: 850 [IU]/h via INTRAVENOUS
  Filled 2013-03-28 (×3): qty 250

## 2013-03-28 MED ORDER — MEMANTINE HCL 5 MG PO TABS
5.0000 mg | ORAL_TABLET | Freq: Two times a day (BID) | ORAL | Status: DC
  Administered 2013-03-28 – 2013-03-31 (×6): 5 mg via ORAL
  Filled 2013-03-28 (×8): qty 1

## 2013-03-28 MED ORDER — ATORVASTATIN CALCIUM 40 MG PO TABS
40.0000 mg | ORAL_TABLET | Freq: Every day | ORAL | Status: DC
  Administered 2013-03-29 – 2013-03-30 (×2): 40 mg via ORAL
  Filled 2013-03-28 (×5): qty 1

## 2013-03-28 MED ORDER — ONDANSETRON HCL 4 MG/2ML IJ SOLN
4.0000 mg | Freq: Four times a day (QID) | INTRAMUSCULAR | Status: DC | PRN
  Filled 2013-03-28: qty 2

## 2013-03-28 MED ORDER — ASPIRIN 81 MG PO CHEW
324.0000 mg | CHEWABLE_TABLET | Freq: Once | ORAL | Status: AC
  Administered 2013-03-28: 324 mg via ORAL
  Filled 2013-03-28: qty 4

## 2013-03-28 MED ORDER — HEPARIN BOLUS VIA INFUSION
3000.0000 [IU] | Freq: Once | INTRAVENOUS | Status: AC
  Administered 2013-03-28: 3000 [IU] via INTRAVENOUS

## 2013-03-28 MED ORDER — DONEPEZIL HCL 10 MG PO TABS
10.0000 mg | ORAL_TABLET | Freq: Every day | ORAL | Status: DC
Start: 2013-03-28 — End: 2013-03-31
  Administered 2013-03-28 – 2013-03-30 (×3): 10 mg via ORAL
  Filled 2013-03-28 (×5): qty 1

## 2013-03-28 NOTE — Progress Notes (Signed)
ANTICOAGULATION CONSULT NOTE - Initial Consult  Pharmacy Consult for heparin Indication: chest pain/ACS  Allergies  Allergen Reactions  . Ultram [Tramadol]     Listed on [facility]    Patient Measurements:   Estimated weight: 73kg Estimated heigh: 64inches Heparin dosing weight: 69.8kg   Vital Signs: Temp: 96.8 F (36 C) (08/23 1042) Temp src: Rectal (08/23 1042) BP: 108/69 mmHg (08/23 1545) Pulse Rate: 102 (08/23 1545)  Labs:  Recent Labs  03/28/13 1327  HGB 13.5  HCT 38.2  PLT 117*  CREATININE 1.17*  TROPONINI 0.77*    The CrCl is unknown because both a height and weight (above a minimum accepted value) are required for this calculation.   Medical History: Past Medical History  Diagnosis Date  . Emphysema   . Lung nodule   . Diverticulosis of colon   . Dysphagia   . Peptic stricture of esophagus   . Acute myocardial infarction   . CAD (coronary artery disease)     Assessment: 74 YOF admitted with CP and hypotension. Pharmacy asked to dose heparin. Spoke with patient and she did not know most recent weight, but told me that the historical 155lbs (70.3kg) sounded accurate- will add a few pounds to that as it is a few months old.  Reportedly has a history of MI, but no other details are currently available. Initial troponin elevated. CBC all WNL.   Goal of Therapy:  Heparin level 0.3-0.7 units/ml Monitor platelets by anticoagulation protocol: Yes   Plan:  1. Bolus with 3000 units IV heparin x1 2. Start heparin drip at 850 units/hour 3. First heparin level in 8 hours 4. Daily HL and CBC 5. Follow up for signs of bleeding 6. Follow up plans for cath  Randolf Sansoucie D. Trine Fread, PharmD Clinical Pharmacist Pager: [phone] 03/28/2013 4:33 PM

## 2013-03-28 NOTE — Progress Notes (Addendum)
Call placed to Dr. Antoine Poche for persist unrelieved mid sternal CP that has been going on all day. CP now a 6/10 Pt received 3 Ntg 's prior to my assuming care of pt . Pt medicated with Tylenol for c/o HA. New orders received to start NTG gtt.Made aware of latest troponin of 1.11 at 18:30. Made aware that pt was hypotensive on admission. Present B/P=127/71

## 2013-03-28 NOTE — ED Notes (Signed)
Patient presents to ED via EMS from nursing home [place]. EMS was called for chest pain. Patient reported to staff that she had chest pain after having bowel movement, staff helped patient to return to her bed. When EMS arrived at scene patient sitting up in bed alert only complaints of "feeling rough", no chest pain. CBG 108 first SBP 72 palpated.

## 2013-03-28 NOTE — ED Provider Notes (Addendum)
CSN: [id number]     Arrival date & time 03/28/13  1029 History     First MD Initiated Contact with Patient 03/28/13 1032     Chief Complaint  Patient presents with  . Chest Pain   (Consider location/radiation/quality/duration/timing/severity/associated sxs/prior Treatment) HPI Comments: Presents to the ER for evaluation from the nursing home. Patient was reportedly found in the bathroom, slumped over. Staff reported they brought her back to her bed she was complaining of chest pain. EMS was contacted. They report that they found her in bed, distressed. She was not responding well to them, the only thing she would say was that she was "feeling roughl". This reportedly was hypotensive initially with a systolic blood pressure of 72. EMS was unable to establish IV access, brought her to the ER. Last blood pressure, however was 104 systolic. At arrival to the ER, patient intermittently answers questions. She denies chest pain, but does not answer any other questions. Based on the acuity of her condition, level V Caveat applies.  Patient is a 79 y.o. female presenting with chest pain.  Chest Pain   Past Medical History  Diagnosis Date  . Emphysema   . Lung nodule   . Diverticulosis of colon   . Dysphagia   . Peptic stricture of esophagus   . Acute myocardial infarction   . CAD (coronary artery disease)    History reviewed. No pertinent past surgical history. History reviewed. No pertinent family history. History  Substance Use Topics  . Smoking status: Unknown If Ever Smoked  . Smokeless tobacco: Not on file  . Alcohol Use: Not on file   OB History   Grav Para Term Preterm Abortions TAB SAB Ect Mult Living                 Review of Systems  Unable to perform ROS: Acuity of condition  Cardiovascular: Positive for chest pain.    Allergies  Ultram  Home Medications   Current Outpatient Rx  Name  Route  Sig  Dispense  Refill  . amLODipine (NORVASC) 10 MG tablet   Oral    Take 0.5 tablets (5 mg total) by mouth daily.   30 tablet   99   . calcium-vitamin D (OSCAL WITH D) 500-200 MG-UNIT per tablet   Oral   Take 1 tablet by mouth daily.         . diclofenac sodium (VOLTAREN) 1 % GEL   Topical   Apply 2 g topically 4 (four) times daily. To right knee         . divalproex (DEPAKOTE) 250 MG DR tablet   Oral   Take 250 mg by mouth at bedtime.         . donepezil (ARICEPT) 10 MG tablet   Oral   Take 10 mg by mouth at bedtime.         Marland Kitchen HYDROcodone-acetaminophen (NORCO/VICODIN) 5-325 MG per tablet      Take one tablet by mouth three times a day   90 tablet   5   . memantine (NAMENDA) 5 MG tablet   Oral   Take 5 mg by mouth 2 (two) times daily.         Marland Kitchen omeprazole (PRILOSEC) 40 MG capsule   Oral   Take 40 mg by mouth daily.         . pregabalin (LYRICA) 50 MG capsule   Oral   Take 1 capsule (50 mg total) by mouth 2 (two) times  daily.   60 capsule   3   . simvastatin (ZOCOR) 20 MG tablet   Oral   Take 20 mg by mouth at bedtime.         Marland Kitchen zolpidem (AMBIEN) 5 MG tablet   Oral   Take 5 mg by mouth at bedtime as needed for sleep.          BP 104/66  Pulse 99  Temp(Src) 96.8 F (36 C) (Rectal)  Resp 22  SpO2 97% Physical Exam  Constitutional: She is oriented to person, place, and time. She appears well-developed and well-nourished. She appears distressed.  HENT:  Head: Normocephalic and atraumatic.  Right Ear: Hearing normal.  Left Ear: Hearing normal.  Nose: Nose normal.  Mouth/Throat: Oropharynx is clear and moist and mucous membranes are normal.  Eyes: Conjunctivae and EOM are normal. Pupils are equal, round, and reactive to light.  Neck: Normal range of motion. Neck supple.  Cardiovascular: Regular rhythm, S1 normal and S2 normal.  Exam reveals no gallop and no friction rub.   No murmur heard. Pulmonary/Chest: Breath sounds normal. Tachypnea noted. No respiratory distress. She exhibits no tenderness.   Abdominal: Soft. Normal appearance and bowel sounds are normal. There is no hepatosplenomegaly. There is no tenderness. There is no rebound, no guarding, no tenderness at McBurney's point and negative Murphy's sign. No hernia.  Musculoskeletal: Normal range of motion.  Neurological: She is alert and oriented to person, place, and time. She has normal strength. No cranial nerve deficit or sensory deficit. Coordination normal. GCS eye subscore is 4. GCS verbal subscore is 5. GCS motor subscore is 6.  Skin: Skin is warm, dry and intact. No rash noted. No cyanosis.  Psychiatric: She has a normal mood and affect. Her speech is normal and behavior is normal. Thought content normal.    ED Course   Procedures (including critical care time)  Left brachial artery blood draw performed by me: Area was sterilely prepped. Ultrasound was utilized to identify the artery. A 21-gauge needle attached to a 20 mL syringe was introduced into the vessel under aspiration and blood was drawn. It was removed and direct pressure was held for more than a minute and a sterile dressing was placed. Patient tolerated procedure well. No complications.  EKG #1:  Date: 03/28/2013  Rate: 97  Rhythm: normal sinus rhythm  QRS Axis: left  Intervals: normal  ST/T Wave abnormalities: nonspecific ST/T changes  Conduction Disutrbances:none  Narrative Interpretation:   Old EKG Reviewed: unchanged  EKG #2:  Date: 03/28/2013  Rate: 106  Rhythm: sinus tachycardia  QRS Axis: normal  Intervals: normal  ST/T Wave abnormalities: nonspecific ST/T changes  Conduction Disutrbances:none  Narrative Interpretation:   Old EKG Reviewed: unchanged     Labs Reviewed  CBC WITH DIFFERENTIAL - Abnormal; Notable for the following:    Platelets 117 (*)    Neutrophils Relative % 78 (*)    All other components within normal limits  COMPREHENSIVE METABOLIC PANEL - Abnormal; Notable for the following:    Creatinine, Ser 1.17 (*)     Albumin 3.4 (*)    AST 74 (*)    ALT 63 (*)    GFR calc non Af Amer 43 (*)    GFR calc Af Amer 50 (*)    All other components within normal limits  TROPONIN I - Abnormal; Notable for the following:    Troponin I 0.77 (*)    All other components within normal limits  URINALYSIS, ROUTINE W  REFLEX MICROSCOPIC - Abnormal; Notable for the following:    APPearance CLOUDY (*)    Hgb urine dipstick TRACE (*)    Leukocytes, UA SMALL (*)    All other components within normal limits  URINE MICROSCOPIC-ADD ON - Abnormal; Notable for the following:    Squamous Epithelial / LPF FEW (*)    Bacteria, UA FEW (*)    All other components within normal limits  CULTURE, BLOOD (ROUTINE X 2)  CULTURE, BLOOD (ROUTINE X 2)  PRO B NATRIURETIC PEPTIDE  CG4 I-STAT (LACTIC ACID)   Dg Chest Port 1 View  03/28/2013   *RADIOLOGY REPORT*  Clinical Data: Chest pain  PORTABLE CHEST - 1 VIEW  Comparison: Chest CT and chest radiograph, 7th 22 11  Findings: The cardiac silhouette is normal in size and configuration.  The mediastinum is normal in contour caliber.  No hilar masses.  Stable right upper lobe nodule, benign.  The lungs are clear.  No pleural effusion or pneumothorax.  The bony thorax is demineralized but grossly intact.  IMPRESSION: No acute cardiopulmonary disease.   Original Report Authenticated By: Amie Portland, M.D.   Diagnosis: Chest pain, elevated troponin, suspect non-ST elevation MI  MDM  Patient presents to the ER for evaluation of chest pain. She is a very poor historian. Records indicate that she has a history of coronary artery disease, but it is not clear she has stents. At arrival to the ER, patient's symptoms had resolved. She was no longer experiencing chest pain and her hypotension had resolved. She was mildly hypothermic. She did not have obvious abscess but this was considered. Workup was initiated including sepsis workup and cardiac workup. Initial EKG did not show any evidence of ischemia or  infarct. There was delay in drawing blood because nursing staff and phlebotomy were unable to get blood. I performed a brachial artery blood draw myself. Ultimately she was found to have an elevated troponin. When I reevaluated her at this time, she is now complaining of chest pain once again. Patient administered aspirin and nitroglycerin, Lovenox cardiology consultation. They will see the patient in the ER.   Gilda Crease, MD 03/28/13 1534  Gilda Crease, MD 03/28/13 1535

## 2013-03-28 NOTE — H&P (Signed)
Physician History and Physical    Sharon [name] MRN: [id number] DOB/AGE: 07-Jun-1933 79 y.o. Admit date: 03/28/2013  Primary Care Physician: Primary Cardiologist: Jerral Bonito, MD  HPI: Sharon [name] is a 79 y.o. Woman who is a resident of a nursing home, who has been admitted for chest pain and hypotension. She is a nursing home resident. She tells me she first began experiencing chest pain at the beginning of last week, associated with a headache. She said "it wasn't bad" and she took some Tylenol for it. Earlier today she was reportedly found slumped over in the bathroom and had been c/o chest pain. She was noted to be hypotensive with a SBP of 72 mmHg, which had increased to 104 mmHg in the ED at [facility]. She was given a SL nitro along with ASA.  At the present time, she describes a substernal dull chest pain associated with shortness of breath and lightheadedness, but denies palpitations. She said she "had a heart attack a long time ago", but has no recollection of any pertinent details. The last cardiology office visit note I was able to locate was from May 2012 with Dr. Jerral Bonito, at which time he also noted that the patient reported a h/o an MI, but it was never confirmed, and she was deemed to have some element of musculoskeletal pain and GERD at that time.   Review of systems complete and found to be negative unless listed above   SocHx: she quit smoking 6-7 years ago, and says "I didn't smoke much before then". She has 4 grown sons and is not married.  Allergies   Ultram  Home Medications    Current Outpatient Rx   Name   Route   Sig   Dispense   Refill   .  amLODipine (NORVASC) 10 MG tablet   Oral   Take 0.5 tablets (5 mg total) by mouth daily.   30 tablet   99   .  calcium-vitamin D (OSCAL WITH D) 500-200 MG-UNIT per tablet   Oral   Take 1 tablet by mouth daily.       .  diclofenac sodium (VOLTAREN) 1 % GEL   Topical   Apply 2 g topically 4 (four) times daily. To right knee        .  divalproex (DEPAKOTE) 250 MG DR tablet   Oral   Take 250 mg by mouth at bedtime.       .  donepezil (ARICEPT) 10 MG tablet   Oral   Take 10 mg by mouth at bedtime.       Marland Kitchen  HYDROcodone-acetaminophen (NORCO/VICODIN) 5-325 MG per tablet     Take one tablet by mouth three times a day   90 tablet   5   .  memantine (NAMENDA) 5 MG tablet   Oral   Take 5 mg by mouth 2 (two) times daily.       Marland Kitchen  omeprazole (PRILOSEC) 40 MG capsule   Oral   Take 40 mg by mouth daily.       .  pregabalin (LYRICA) 50 MG capsule   Oral   Take 1 capsule (50 mg total) by mouth 2 (two) times daily.   60 capsule   3   .  simvastatin (ZOCOR) 20 MG tablet   Oral   Take 20 mg by mouth at bedtime.       Marland Kitchen  zolpidem (AMBIEN) 5 MG tablet   Oral   Take 5  mg by mouth at bedtime as needed for sleep.          History reviewed. No pertinent family history.  History   Social History  . Marital Status: Single    Spouse Name: N/A    Number of Children: N/A  . Years of Education: N/A   Occupational History  . Not on file.   Social History Main Topics  . Smoking status: Unknown If Ever Smoked  . Smokeless tobacco: Not on file  . Alcohol Use: Not on file  . Drug Use: Not on file  . Sexual Activity: Not on file   Other Topics Concern  . Not on file   Social History Narrative  . No narrative on file      Physical Exam: Blood pressure 118/78, pulse 113, temperature 96.8 F (36 C), temperature source Rectal, resp. rate 18, SpO2 94.00%.  General: NAD Neck: No JVD, no thyromegaly or thyroid nodule.  Lungs: Clear to auscultation bilaterally with normal respiratory effort. CV: Nondisplaced PMI.  Heart regular S1/S2, no S3/S4, no murmur.  No peripheral edema.  No carotid bruit.  Normal pedal pulses.  Abdomen: Soft, nontender, no hepatosplenomegaly, no distention.  Skin: Intact without lesions or rashes.  Neurologic: Alert and oriented x 3.  Psych: Normal affect. Extremities: No clubbing or cyanosis.  HEENT: Normal.    Labs:   Lab Results  Component Value Date   WBC 9.4 03/28/2013   HGB 13.5 03/28/2013   HCT 38.2 03/28/2013   MCV 85.3 03/28/2013   PLT 117* 03/28/2013    Recent Labs Lab 03/28/13 1327  NA 144  K 3.9  CL 109  CO2 23  BUN 14  CREATININE 1.17*  CALCIUM 9.0  PROT 6.7  BILITOT 0.4  ALKPHOS 91  ALT 63*  AST 74*  GLUCOSE 93   Lab Results  Component Value Date   CKTOTAL 88 02/25/2010   CKMB 0.8 02/25/2010   TROPONINI 0.77* 03/28/2013    Lab Results  Component Value Date   CHOL  Value: 140        ATP III CLASSIFICATION:  <200     mg/dL   Desirable  161-096  mg/dL   Borderline High  >=045    mg/dL   High        11/12/8117   CHOL  Value: 200        ATP III CLASSIFICATION:  <200     mg/dL   Desirable  147-829  mg/dL   Borderline High  >=562    mg/dL   High        09/05/8655   CHOL  Value: 157        ATP III CLASSIFICATION:  <200     mg/dL   Desirable  846-962  mg/dL   Borderline High  >=952    mg/dL   High 84/13/2440   Lab Results  Component Value Date   HDL 45 02/25/2010   HDL 46 08/08/7251   HDL 47 66/44/0347   Lab Results  Component Value Date   LDLCALC  Value: 80        Total Cholesterol/HDL:CHD Risk Coronary Heart Disease Risk Table                     Men   Women  1/2 Average Risk   3.4   3.3  Average Risk       5.0   4.4  2 X Average Risk   9.6  7.1  3 X Average Risk  23.4   11.0        Use the calculated Patient Ratio above and the CHD Risk Table to determine the patient's CHD Risk.        ATP III CLASSIFICATION (LDL):  <100     mg/dL   Optimal  161-096  mg/dL   Near or Above                    Optimal  130-159  mg/dL   Borderline  045-409  mg/dL   High  >811     mg/dL   Very High 04/19/7828   LDLCALC  Value: 139        Total Cholesterol/HDL:CHD Risk Coronary Heart Disease Risk Table                     Men   Women  1/2 Average Risk   3.4   3.3  Average Risk       5.0   4.4  2 X Average Risk   9.6   7.1  3 X Average Risk  23.4   11.0        Use the calculated Patient Ratio above  and the CHD Risk Table to determine the patient's CHD Risk.        ATP III CLASSIFICATION (LDL):  <100     mg/dL   Optimal  562-130  mg/dL   Near or Above                    Optimal  130-159  mg/dL   Borderline  865-784  mg/dL   High  >696     mg/dL   Very High* 2/95/2841   LDLCALC  Value: 94        Total Cholesterol/HDL:CHD Risk Coronary Heart Disease Risk Table                     Men   Women  1/2 Average Risk   3.4   3.3 05/28/2008   Lab Results  Component Value Date   TRIG 76 02/25/2010   TRIG 73 10/29/2009   TRIG 78 05/28/2008   Lab Results  Component Value Date   CHOLHDL 3.1 02/25/2010   CHOLHDL 4.3 10/29/2009   CHOLHDL 3.3 05/28/2008   No results found for this basename: LDLDIRECT       EKG: Sinus rhythm, rate 97 bpm, axis within normal limits, intervals within normal limits, nonspecific T wave abnormality Repeat EKG: sinus tachycardia, 106 bpm, nonspecific ST abnormality  Radiology: No acute cardiopulmonary disease.    ASSESSMENT AND PLAN:  1. NSTEMI: she reportedly has a h/o an MI in the past, but I have no further details regarding it. I will start her on ASA, heparin, atorvastatin, metoprolol, and IV nitroglycerin as well (titrate to chest pain and BP). I will check two more sets of troponins, as well as a lipid panel. I will obtain an echocardiogram. She will likely need cardiac catheterization on 03/30/2013.  2. Hyperlipidemia: will check lipid panel, and start atorvastatin.  3. Hypertension: initially hypotensive, which has since resolved. Will hold amlodipine, but will start metoprolol.  Signed: Prentice Docker, M.D., F.A.C.C. 03/28/2013, 3:51 PM

## 2013-03-29 DIAGNOSIS — F0280 Dementia in other diseases classified elsewhere without behavioral disturbance: Secondary | ICD-10-CM

## 2013-03-29 DIAGNOSIS — I369 Nonrheumatic tricuspid valve disorder, unspecified: Secondary | ICD-10-CM

## 2013-03-29 LAB — BASIC METABOLIC PANEL
BUN: 15 mg/dL (ref 6–23)
CO2: 23 mEq/L (ref 19–32)
Calcium: 8.9 mg/dL (ref 8.4–10.5)
Creatinine, Ser: 1.04 mg/dL (ref 0.50–1.10)
GFR calc non Af Amer: 50 mL/min — ABNORMAL LOW (ref >90)
Glucose, Bld: 96 mg/dL (ref 70–99)
Sodium: 144 mEq/L (ref 135–145)

## 2013-03-29 LAB — CBC
MCH: 29.6 pg (ref 26.0–34.0)
MCHC: 34.7 g/dL (ref 30.0–36.0)
MCV: 85.3 fL (ref 78.0–100.0)
Platelets: 119 10*3/uL — ABNORMAL LOW (ref 150–400)
RDW: 14.4 % (ref 11.5–15.5)

## 2013-03-29 LAB — HEPARIN LEVEL (UNFRACTIONATED): Heparin Unfractionated: 0.48 IU/mL (ref 0.30–0.70)

## 2013-03-29 MED ORDER — SODIUM CHLORIDE 0.9 % IJ SOLN: 3.0000 mL | INTRAMUSCULAR | Status: DC | PRN

## 2013-03-29 MED ORDER — SODIUM CHLORIDE 0.9 % IV SOLN
INTRAVENOUS | Status: DC
  Administered 2013-03-30: 75 mL/h via INTRAVENOUS

## 2013-03-29 MED ORDER — METOPROLOL SUCCINATE ER 50 MG PO TB24
50.0000 mg | ORAL_TABLET | Freq: Every day | ORAL | Status: DC
  Administered 2013-03-29 – 2013-03-31 (×3): 50 mg via ORAL
  Filled 2013-03-29 (×3): qty 1

## 2013-03-29 MED ORDER — ASPIRIN 81 MG PO CHEW
324.0000 mg | CHEWABLE_TABLET | ORAL | Status: AC
  Administered 2013-03-30: 324 mg via ORAL
  Filled 2013-03-29: qty 4

## 2013-03-29 MED ORDER — SODIUM CHLORIDE 0.9 % IJ SOLN
3.0000 mL | Freq: Two times a day (BID) | INTRAMUSCULAR | Status: DC
  Administered 2013-03-30: 3 mL via INTRAVENOUS

## 2013-03-29 MED ORDER — SODIUM CHLORIDE 0.9 % IV SOLN: 250.0000 mL | INTRAVENOUS | Status: DC | PRN

## 2013-03-29 NOTE — Progress Notes (Addendum)
SUBJECTIVE: Sharon [name] denies chest pain at present, and is on 3 mcg/min of IV nitro. She has mild SOB, but this has improved. Troponin peaked at 1.3.  She wants to know what foods in the future are beneficial for her heart.Her nurse reports the patient is forgetful, and the patient states she has Alzheimer's dementia.   Filed Vitals:   03/29/13 0300 03/29/13 0400 03/29/13 0454 03/29/13 0600  BP: 121/68 107/73 127/71 127/68  Pulse: 92 124 100 102  Temp:   98.1 F (36.7 C)   TempSrc:   Oral   Resp: 11 16 15 23   Height:      Weight:   173 lb 11.6 oz (78.8 kg)   SpO2: 100% 97% 94% 95%    Intake/Output Summary (Last 24 hours) at 03/29/13 0840 Last data filed at 03/29/13 0600  Gross per 24 hour  Intake 355.24 ml  Output    550 ml  Net -194.76 ml    PHYSICAL EXAM General: NAD Neck: No JVD, no thyromegaly or thyroid nodule.  Lungs: Clear to auscultation bilaterally with normal respiratory effort. CV: Nondisplaced PMI.  Heart regular rhythm, HR 90's, normal S1/split S2, no S3/S4, no murmur.  No peripheral edema.  No carotid bruit.  Normal pedal pulses.  Abdomen: Soft, nontender, no hepatosplenomegaly, no distention.  Neurologic: Alert and oriented x 3.  Psych: Normal affect. Extremities: No clubbing or cyanosis.   TELEMETRY: Reviewed telemetry pt in sinus rhythm, occasionally sinus tachycardia.  LABS: Basic Metabolic Panel:  Recent Labs  16/10/96 1327 03/29/13 0500  NA 144 144  K 3.9 3.6  CL 109 109  CO2 23 23  GLUCOSE 93 96  BUN 14 15  CREATININE 1.17* 1.04  CALCIUM 9.0 8.9   Liver Function Tests:  Recent Labs  03/28/13 1327  AST 74*  ALT 63*  ALKPHOS 91  BILITOT 0.4  PROT 6.7  ALBUMIN 3.4*   No results found for this basename: LIPASE, AMYLASE,  in the last 72 hours CBC:  Recent Labs  03/28/13 1327 03/29/13 0500  WBC 9.4 8.6  NEUTROABS 7.3  --   HGB 13.5 12.9  HCT 38.2 37.2  MCV 85.3 85.3  PLT 117* 119*   Cardiac Enzymes:  Recent Labs  03/28/13 1327 03/28/13 1831 03/28/13 2358  TROPONINI 0.77* 1.11* 1.30*   BNP: No components found with this basename: POCBNP,  D-Dimer: No results found for this basename: DDIMER,  in the last 72 hours Hemoglobin A1C: No results found for this basename: HGBA1C,  in the last 72 hours Fasting Lipid Panel:  Recent Labs  03/28/13 1831  CHOL 206*  HDL 67  LDLCALC 128*  TRIG 55  CHOLHDL 3.1   Thyroid Function Tests: No results found for this basename: TSH, T4TOTAL, FREET3, T3FREE, THYROIDAB,  in the last 72 hours Anemia Panel: No results found for this basename: VITAMINB12, FOLATE, FERRITIN, TIBC, IRON, RETICCTPCT,  in the last 72 hours  RADIOLOGY: Dg Chest Port 1 View  03/28/2013   *RADIOLOGY REPORT*  Clinical Data: Chest pain  PORTABLE CHEST - 1 VIEW  Comparison: Chest CT and chest radiograph, 7th 22 11  Findings: The cardiac silhouette is normal in size and configuration.  The mediastinum is normal in contour caliber.  No hilar masses.  Stable right upper lobe nodule, benign.  The lungs are clear.  No pleural effusion or pneumothorax.  The bony thorax is demineralized but grossly intact.  IMPRESSION: No acute cardiopulmonary disease.   Original Report Authenticated  By: Amie Portland, M.D.      ASSESSMENT AND PLAN: 1. NSTEMI: she reportedly has a h/o an MI in the past, but I have no further details regarding it. Continue ASA, heparin, atorvastatin, metoprolol (increase to 50 mg daily), and IV nitroglycerin as well (titrate to chest pain and BP).  Troponins peaked at 1.3. Lipid panel showed LDL 128, and she is now on atorvastatin. I will f/u on results of her echocardiogram. Will plan for cardiac catheterization on 03/30/2013.  2. Hyperlipidemia: LDL 128, continue atorvastatin.  3. Hypertension: initially hypotensive on admission, which has since resolved. Amlodipine held on admission, continue metoprolol (increase to 50 mg daily).    Prentice Docker, M.D., F.A.C.C.

## 2013-03-29 NOTE — Progress Notes (Signed)
Pt and her son Sharon [name] watched the Cath/PCI video, and pt signed the consent for the procedure.

## 2013-03-29 NOTE — Progress Notes (Signed)
ANTICOAGULATION CONSULT NOTE - Follow Up Consult  Pharmacy Consult for heparin Indication: chest pain/ACS  Allergies  Allergen Reactions  . Ultram [Tramadol]     Listed on [facility]    Patient Measurements: Height: 5\' 6"  (167.6 cm) Weight: 173 lb 11.6 oz (78.8 kg) IBW/kg (Calculated) : 59.3 Heparin Dosing Weight: 70kg  Vital Signs: Temp: 98.1 F (36.7 C) (08/24 0454) Temp src: Oral (08/24 0454) BP: 127/68 mmHg (08/24 0600) Pulse Rate: 102 (08/24 0600)  Labs:  Recent Labs  03/28/13 1327 03/28/13 1831 03/28/13 2358 03/29/13 0500 03/29/13 0718  HGB 13.5  --   --  12.9  --   HCT 38.2  --   --  37.2  --   PLT 117*  --   --  119*  --   HEPARINUNFRC  --   --  0.37  --  0.48  CREATININE 1.17*  --   --  1.04  --   TROPONINI 0.77* 1.11* 1.30*  --   --     Estimated Creatinine Clearance: 47.2 ml/min (by C-G formula based on Cr of 1.04).   Medications:  Scheduled:  . aspirin EC  325 mg Oral Daily  . atorvastatin  40 mg Oral q1800  . calcium-vitamin D  1 tablet Oral Q breakfast  . divalproex  250 mg Oral QHS  . donepezil  10 mg Oral QHS  . memantine  5 mg Oral BID  . metoprolol succinate  25 mg Oral Daily  . pantoprazole  40 mg Oral Daily  . pregabalin  50 mg Oral BID   Infusions:  . heparin 850 Units/hr (03/28/13 2000)  . nitroGLYCERIN 3 mcg/min (03/28/13 2213)    Assessment: 79 YOF admitted with CP and noted with NSTEMI on heparin and at goal (HL= 0.48). Patient noted for cath on Monday.   Goal of Therapy:  Heparin level 0.3-0.7 units/ml Monitor platelets by anticoagulation protocol: Yes   Plan:  -No heparin changes needed -Daily heparin level and CBC  Harland German, Pharm D 03/29/2013 8:05 AM

## 2013-03-29 NOTE — Progress Notes (Signed)
Latest Troponin 1.30. Pt asleep. VSS. No further c/o CP.Ntg gtt continues to infuse at .

## 2013-03-29 NOTE — Progress Notes (Signed)
Echo Lab  2D Echocardiogram completed.  Virgen Belland L Jaylene Arrowood, RDCS 03/29/2013 9:58 AM

## 2013-03-29 NOTE — Progress Notes (Signed)
03/29/13  Pharmacy-  Heparin 0135  Heparin level 0.37  A/P:  79yo female with chest pain.  Heparin level therapeutic on current rate.  No bleeding problems noted.    1.  Continue Heparin 850 units/hr 2.  Check Heparin level with AM labs  Marisue Humble, PharmD Clinical Pharmacist [facility]

## 2013-03-30 ENCOUNTER — Encounter (HOSPITAL_COMMUNITY)
Admission: EM | Disposition: A | Discharge status: Home / Self Care | Payer: Self-pay | Source: Home / Self Care | Attending: Cardiovascular Disease

## 2013-03-30 DIAGNOSIS — R079 Chest pain, unspecified: Secondary | ICD-10-CM

## 2013-03-30 HISTORY — PX: LEFT HEART CATHETERIZATION WITH CORONARY ANGIOGRAM: SHX5451

## 2013-03-30 LAB — BASIC METABOLIC PANEL
BUN: 14 mg/dL (ref 6–23)
Calcium: 9.1 mg/dL (ref 8.4–10.5)
Creatinine, Ser: 1.08 mg/dL (ref 0.50–1.10)
GFR calc Af Amer: 55 mL/min — ABNORMAL LOW (ref >90)
GFR calc non Af Amer: 48 mL/min — ABNORMAL LOW (ref >90)

## 2013-03-30 LAB — PROTIME-INR
INR: 1.02 (ref 0.00–1.49)
Prothrombin Time: 13.2 seconds (ref 11.6–15.2)

## 2013-03-30 LAB — CBC
MCH: 29 pg (ref 26.0–34.0)
MCHC: 34 g/dL (ref 30.0–36.0)
RDW: 14.4 % (ref 11.5–15.5)

## 2013-03-30 LAB — HEPARIN LEVEL (UNFRACTIONATED): Heparin Unfractionated: 0.41 IU/mL (ref 0.30–0.70)

## 2013-03-30 SURGERY — LEFT HEART CATHETERIZATION WITH CORONARY ANGIOGRAM
Anesthesia: LOCAL

## 2013-03-30 MED ORDER — NITROGLYCERIN 0.2 MG/ML ON CALL CATH LAB
INTRAVENOUS | Status: AC
  Filled 2013-03-30: qty 1

## 2013-03-30 MED ORDER — LIDOCAINE HCL (PF) 1 % IJ SOLN
INTRAMUSCULAR | Status: AC
  Filled 2013-03-30: qty 30

## 2013-03-30 MED ORDER — HEPARIN (PORCINE) IN NACL 2-0.9 UNIT/ML-% IJ SOLN
INTRAMUSCULAR | Status: AC
  Filled 2013-03-30: qty 2000

## 2013-03-30 MED ORDER — VERAPAMIL HCL 2.5 MG/ML IV SOLN
INTRAVENOUS | Status: AC
  Filled 2013-03-30: qty 2

## 2013-03-30 MED ORDER — HEPARIN SODIUM (PORCINE) 1000 UNIT/ML IJ SOLN
INTRAMUSCULAR | Status: AC
  Filled 2013-03-30: qty 1

## 2013-03-30 MED ORDER — SODIUM CHLORIDE 0.9 % IV SOLN
1.0000 mL/kg/h | INTRAVENOUS | Status: AC
  Administered 2013-03-30: 1 mL/kg/h via INTRAVENOUS

## 2013-03-30 MED ORDER — ASPIRIN EC 81 MG PO TBEC
81.0000 mg | DELAYED_RELEASE_TABLET | Freq: Every day | ORAL | Status: DC
  Administered 2013-03-31: 81 mg via ORAL
  Filled 2013-03-30: qty 1

## 2013-03-30 NOTE — Progress Notes (Signed)
ANTICOAGULATION CONSULT NOTE - Follow Up Consult  Pharmacy Consult for Heparin Indication: chest pain/ACS  Allergies  Allergen Reactions  . Ultram [Tramadol]     Listed on [facility]    Patient Measurements: Height: 5\' 6"  (167.6 cm) Weight: 169 lb 5 oz (76.8 kg) IBW/kg (Calculated) : 59.3 Heparin Dosing Weight: 70 kg  Vital Signs: Temp: 98.4 F (36.9 C) (08/25 0727) Temp src: Oral (08/25 0727) BP: 134/67 mmHg (08/25 0727) Pulse Rate: 60 (08/25 0727)  Labs:  Recent Labs  03/28/13 1327 03/28/13 1831 03/28/13 2358 03/29/13 0500 03/29/13 0718 03/30/13 0640  HGB 13.5  --   --  12.9  --  11.9*  HCT 38.2  --   --  37.2  --  35.0*  PLT 117*  --   --  119*  --  113*  HEPARINUNFRC  --   --  0.37  --  0.48 0.41  CREATININE 1.17*  --   --  1.04  --  1.08  TROPONINI 0.77* 1.11* 1.30*  --   --   --     Estimated Creatinine Clearance: 44.9 ml/min (by C-G formula based on Cr of 1.08).   Medications:  Heparin drip at 850 units/hr  Assessment: 79 y/o F admitted with CP/NSTEMI on heparin drip. HL this AM is therapeutic at 0.41. Hgb 11.9. Renal function stable. No overt bleeding noted. Plan for cath today.   Goal of Therapy:  Heparin level 0.3-0.7 units/ml Monitor platelets by anticoagulation protocol: Yes   Plan:  -Continue heparin drip at 850 units/hr -F/U post-cath for any further plans  Thank you for allowing me to take part in this patient's care,  Abran Duke, PharmD Clinical Pharmacist Phone: [phone] Pager: [phone] 03/30/2013 9:05 AM

## 2013-03-30 NOTE — Progress Notes (Signed)
TR BAND REMOVAL  LOCATION:    right radial  DEFLATED PER PROTOCOL:    yes  TIME BAND OFF / DRESSING APPLIED:    19:30   SITE UPON ARRIVAL:    Level 0  SITE AFTER BAND REMOVAL:    Level 0  REVERSE ALLEN'S TEST:     positive  CIRCULATION SENSATION AND MOVEMENT:    Within Normal Limits   yes  COMMENTS:    

## 2013-03-30 NOTE — H&P (View-Only) (Signed)
Patient ID: Sharon [name], female   DOB: 01/27/1933, 79 y.o.   MRN: [id number]   SUBJECTIVE: No pain. For cath today. 2D Echo shows normal LV function.   Filed Vitals:   03/30/13 0200 03/30/13 0405 03/30/13 0510 03/30/13 0543  BP: 108/46 110/49 110/49   Pulse: 69 66 63   Temp:   98 F (36.7 C)   TempSrc:   Oral   Resp: 13 9 9   Height:      Weight:    169 lb 5 oz (76.8 kg)  SpO2: 94% 97% 98%     Intake/Output Summary (Last 24 hours) at 03/30/13 0704 Last data filed at 03/30/13 0542  Gross per 24 hour  Intake  796.8 ml  Output   1175 ml  Net -378.2 ml    LABS: Basic Metabolic Panel:  Recent Labs  03/28/13 1327 03/29/13 0500  NA 144 144  K 3.9 3.6  CL 109 109  CO2 23 23  GLUCOSE 93 96  BUN 14 15  CREATININE 1.17* 1.04  CALCIUM 9.0 8.9   Liver Function Tests:  Recent Labs  03/28/13 1327  AST 74*  ALT 63*  ALKPHOS 91  BILITOT 0.4  PROT 6.7  ALBUMIN 3.4*   No results found for this basename: LIPASE, AMYLASE,  in the last 72 hours CBC:  Recent Labs  03/28/13 1327 03/29/13 0500 03/30/13 0640  WBC 9.4 8.6 6.8  NEUTROABS 7.3  --   --   HGB 13.5 12.9 11.9*  HCT 38.2 37.2 35.0*  MCV 85.3 85.3 85.4  PLT 117* 119* 113*   Cardiac Enzymes:  Recent Labs  03/28/13 1327 03/28/13 1831 03/28/13 2358  TROPONINI 0.77* 1.11* 1.30*   BNP: No components found with this basename: POCBNP,  D-Dimer: No results found for this basename: DDIMER,  in the last 72 hours Hemoglobin A1C: No results found for this basename: HGBA1C,  in the last 72 hours Fasting Lipid Panel:  Recent Labs  03/28/13 1831  CHOL 206*  HDL 67  LDLCALC 128*  TRIG 55  CHOLHDL 3.1   Thyroid Function Tests: No results found for this basename: TSH, T4TOTAL, FREET3, T3FREE, THYROIDAB,  in the last 72 hours  RADIOLOGY: Dg Chest Port 1 View  03/28/2013   *RADIOLOGY REPORT*  Clinical Data: Chest pain  PORTABLE CHEST - 1 VIEW  Comparison: Chest CT and chest radiograph, 7th 22 11   Findings: The cardiac silhouette is normal in size and configuration.  The mediastinum is normal in contour caliber.  No hilar masses.  Stable right upper lobe nodule, benign.  The lungs are clear.  No pleural effusion or pneumothorax.  The bony thorax is demineralized but grossly intact.  IMPRESSION: No acute cardiopulmonary disease.   Original Report Authenticated By: David Ormond, M.D.    PHYSICAL EXAM Oriented. Cardiac: S1, S2   TELEMETRY: I reviewed 03/30/2013.  NSR   ASSESSMENT AND PLAN:    NSTEMI (non-ST elevated myocardial infarction)     For cath today.  Cath lab is aware    Hyperlipidemia  Jamell Opfer 03/30/2013 7:04 AM  

## 2013-03-30 NOTE — CV Procedure (Signed)
   Cardiac Catheterization Procedure Note  Name: Sharon [name] MRN: [id number] DOB: 1933-02-16  Procedure: Left Heart Cath, Selective Coronary Angiography, LV angiography  Indication: 79 yo BF with history of dementia presents with symptoms of chest pain and mildly elevated troponins.   Procedural Details: The right wrist was prepped, draped, and anesthetized with 1% lidocaine. Using the modified Seldinger technique, a 5 French sheath was introduced into the right radial artery. 3 mg of verapamil was administered through the sheath, weight-based unfractionated heparin was administered intravenously. Standard Judkins catheters were used for selective coronary angiography and left ventriculography. Catheter exchanges were performed over an exchange length guidewire. There were no immediate procedural complications. A TR band was used for radial hemostasis at the completion of the procedure.  The patient was transferred to the post catheterization recovery area for further monitoring.  Procedural Findings: Hemodynamics: AO 153/60 mean of 95 mm Hg LV 153/16 mm Hg  Coronary angiography: Coronary dominance: right  Left mainstem: Normal  Left anterior descending (LAD): Mild irregularities in the proximal vessel less than 20%.  Ramus intermediate with mild irregularities.  Left circumflex (LCx): Normal.  Right coronary artery (RCA): Normal.  Left ventriculography: Left ventricular systolic function is normal, LVEF is estimated at 65%, there is no significant mitral regurgitation   Final Conclusions:   1. No significant CAD 2. Normal LV function.  Recommendations: medical management.  Theron Arista [facility] 03/30/2013, 5:06 PM

## 2013-03-30 NOTE — Progress Notes (Signed)
Patient ID: Sharon [name], female   DOB: April 25, 1933, 79 y.o.   MRN: [id number]   SUBJECTIVE: No pain. For cath today. 2D Echo shows normal LV function.   Filed Vitals:   03/30/13 0200 03/30/13 0405 03/30/13 0510 03/30/13 0543  BP: 108/46 110/49 110/49   Pulse: 69 66 63   Temp:   98 F (36.7 C)   TempSrc:   Oral   Resp: 13 9 9    Height:      Weight:    169 lb 5 oz (76.8 kg)  SpO2: 94% 97% 98%     Intake/Output Summary (Last 24 hours) at 03/30/13 0704 Last data filed at 03/30/13 0542  Gross per 24 hour  Intake  796.8 ml  Output   1175 ml  Net -378.2 ml    LABS: Basic Metabolic Panel:  Recent Labs  40/98/11 1327 03/29/13 0500  NA 144 144  K 3.9 3.6  CL 109 109  CO2 23 23  GLUCOSE 93 96  BUN 14 15  CREATININE 1.17* 1.04  CALCIUM 9.0 8.9   Liver Function Tests:  Recent Labs  03/28/13 1327  AST 74*  ALT 63*  ALKPHOS 91  BILITOT 0.4  PROT 6.7  ALBUMIN 3.4*   No results found for this basename: LIPASE, AMYLASE,  in the last 72 hours CBC:  Recent Labs  03/28/13 1327 03/29/13 0500 03/30/13 0640  WBC 9.4 8.6 6.8  NEUTROABS 7.3  --   --   HGB 13.5 12.9 11.9*  HCT 38.2 37.2 35.0*  MCV 85.3 85.3 85.4  PLT 117* 119* 113*   Cardiac Enzymes:  Recent Labs  03/28/13 1327 03/28/13 1831 03/28/13 2358  TROPONINI 0.77* 1.11* 1.30*   BNP: No components found with this basename: POCBNP,  D-Dimer: No results found for this basename: DDIMER,  in the last 72 hours Hemoglobin A1C: No results found for this basename: HGBA1C,  in the last 72 hours Fasting Lipid Panel:  Recent Labs  03/28/13 1831  CHOL 206*  HDL 67  LDLCALC 128*  TRIG 55  CHOLHDL 3.1   Thyroid Function Tests: No results found for this basename: TSH, T4TOTAL, FREET3, T3FREE, THYROIDAB,  in the last 72 hours  RADIOLOGY: Dg Chest Port 1 View  03/28/2013   *RADIOLOGY REPORT*  Clinical Data: Chest pain  PORTABLE CHEST - 1 VIEW  Comparison: Chest CT and chest radiograph, 7th 22 11   Findings: The cardiac silhouette is normal in size and configuration.  The mediastinum is normal in contour caliber.  No hilar masses.  Stable right upper lobe nodule, benign.  The lungs are clear.  No pleural effusion or pneumothorax.  The bony thorax is demineralized but grossly intact.  IMPRESSION: No acute cardiopulmonary disease.   Original Report Authenticated By: Amie Portland, M.D.    PHYSICAL EXAM Oriented. Cardiac: S1, S2   TELEMETRY: I reviewed 03/30/2013.  NSR   ASSESSMENT AND PLAN:    NSTEMI (non-ST elevated myocardial infarction)     For cath today.  Cath lab is aware    Hyperlipidemia  Willa Rough 03/30/2013 7:04 AM

## 2013-03-30 NOTE — Progress Notes (Signed)
  Nutrition Brief Note  Patient identified on the Malnutrition Screening Tool (MST) Report for unsure of any weight loss. From review of usual weights below, patient has not lost any weight. Appetite and PO intake were appropriate PTA. Currently NPO for cardiac cath today.  Wt Readings from Last 15 Encounters:  03/30/13 169 lb 5 oz (76.8 kg)  03/30/13 169 lb 5 oz (76.8 kg)  11/27/12 155 lb (70.308 kg)  07/13/08 150 lb 4 oz (68.153 kg)    Body mass index is 27.34 kg/(m^2). Patient meets criteria for overweight based on current BMI.   Current diet order is NPO for cardiac cath today. Labs and medications reviewed.   No nutrition interventions warranted at this time. If nutrition issues arise, please consult RD.   Joaquin Courts, RD, LDN, CNSC Pager [phone] After Hours Pager [phone]

## 2013-03-30 NOTE — Progress Notes (Signed)
Utilization Review Completed.  

## 2013-03-30 NOTE — Interval H&P Note (Signed)
History and Physical Interval Note:  03/30/2013 4:29 PM  Sharon [name]  has presented today for surgery, with the diagnosis of cp  The various methods of treatment have been discussed with the patient and family. After consideration of risks, benefits and other options for treatment, the patient has consented to  Procedure(s): LEFT HEART CATHETERIZATION WITH CORONARY ANGIOGRAM (N/A) as a surgical intervention .  The patient's history has been reviewed, patient examined, no change in status, stable for surgery.  I have reviewed the patient's chart and labs.  Questions were answered to the patient's satisfaction.    Cath Lab Visit (complete for each Cath Lab visit)  Clinical Evaluation Leading to the Procedure:   ACS: yes  Non-ACS:    Anginal Classification: CCS III  Anti-ischemic medical therapy: Minimal Therapy (1 class of medications)  Non-Invasive Test Results: No non-invasive testing performed  Prior CABG: No previous CABG       Theron Arista [facility] 03/30/2013 4:29 PM

## 2013-03-31 LAB — BASIC METABOLIC PANEL
BUN: 10 mg/dL (ref 6–23)
Calcium: 8.8 mg/dL (ref 8.4–10.5)
Creatinine, Ser: 1.01 mg/dL (ref 0.50–1.10)
GFR calc Af Amer: 60 mL/min — ABNORMAL LOW (ref >90)

## 2013-03-31 LAB — CBC
HCT: 34.6 % — ABNORMAL LOW (ref 36.0–46.0)
MCHC: 34.1 g/dL (ref 30.0–36.0)
MCV: 85 fL (ref 78.0–100.0)
Platelets: 114 10*3/uL — ABNORMAL LOW (ref 150–400)
RDW: 14.1 % (ref 11.5–15.5)
WBC: 6.2 10*3/uL (ref 4.0–10.5)

## 2013-03-31 LAB — CK TOTAL AND CKMB (NOT AT ARMC)
CK, MB: 2.8 ng/mL (ref 0.3–4.0)
Total CK: 105 U/L (ref 7–177)

## 2013-03-31 MED ORDER — GI COCKTAIL ~~LOC~~
30.0000 mL | Freq: Two times a day (BID) | ORAL | Status: DC | PRN
  Administered 2013-03-31: 30 mL via ORAL
  Filled 2013-03-31: qty 30

## 2013-03-31 MED ORDER — CALCIUM & MAGNESIUM CARBONATES 311-232 MG PO TABS: 1.0000 | ORAL_TABLET | Freq: Three times a day (TID) | ORAL | Status: DC | PRN

## 2013-03-31 MED ORDER — POTASSIUM CHLORIDE CRYS ER 20 MEQ PO TBCR
40.0000 meq | EXTENDED_RELEASE_TABLET | Freq: Once | ORAL | Status: AC
  Administered 2013-03-31: 40 meq via ORAL
  Filled 2013-03-31: qty 2

## 2013-03-31 NOTE — Progress Notes (Signed)
Patient ID: Sharon [name], female   DOB: February 03, 1933, 79 y.o.   MRN: [id number]   SUBJECTIVE:  Two-dimensional echo shows normal LV function. Cardiac catheterization shows normal coronary arteries and normal left ventricular function. The patient says she still has some discomfort. She is a difficult historian. I cannot completely explain her abnormal troponins.   Filed Vitals:   03/30/13 1959 03/30/13 2352 03/31/13 0534 03/31/13 0745  BP: 144/59 160/63 151/55 95/79  Pulse: 62 64 69 66  Temp: 97.7 F (36.5 C) 97.8 F (36.6 C) 98.1 F (36.7 C) 98 F (36.7 C)  TempSrc: Oral Oral Oral Oral  Resp: 16 14 17 18   Height:      Weight:   166 lb 14.2 oz (75.7 kg)   SpO2: 100% 100% 100% 100%    Intake/Output Summary (Last 24 hours) at 03/31/13 0819 Last data filed at 03/31/13 0552  Gross per 24 hour  Intake 1642.9 ml  Output    525 ml  Net 1117.9 ml    LABS: Basic Metabolic Panel:  Recent Labs  40/98/11 0640 03/31/13 0400  NA 141 142  K 3.6 3.3*  CL 107 108  CO2 25 24  GLUCOSE 88 71  BUN 14 10  CREATININE 1.08 1.01  CALCIUM 9.1 8.8   Liver Function Tests:  Recent Labs  03/28/13 1327  AST 74*  ALT 63*  ALKPHOS 91  BILITOT 0.4  PROT 6.7  ALBUMIN 3.4*   No results found for this basename: LIPASE, AMYLASE,  in the last 72 hours CBC:  Recent Labs  03/28/13 1327  03/30/13 0640 03/31/13 0400  WBC 9.4  < > 6.8 6.2  NEUTROABS 7.3  --   --   --   HGB 13.5  < > 11.9* 11.8*  HCT 38.2  < > 35.0* 34.6*  MCV 85.3  < > 85.4 85.0  PLT 117*  < > 113* 114*  < > = values in this interval not displayed. Cardiac Enzymes:  Recent Labs  03/28/13 1327 03/28/13 1831 03/28/13 2358  TROPONINI 0.77* 1.11* 1.30*   BNP: No components found with this basename: POCBNP,  D-Dimer: No results found for this basename: DDIMER,  in the last 72 hours Hemoglobin A1C: No results found for this basename: HGBA1C,  in the last 72 hours Fasting Lipid Panel:  Recent Labs   03/28/13 1831  CHOL 206*  HDL 67  LDLCALC 128*  TRIG 55  CHOLHDL 3.1   Thyroid Function Tests: No results found for this basename: TSH, T4TOTAL, FREET3, T3FREE, THYROIDAB,  in the last 72 hours  RADIOLOGY: Dg Chest Port 1 View  03/28/2013   *RADIOLOGY REPORT*  Clinical Data: Chest pain  PORTABLE CHEST - 1 VIEW  Comparison: Chest CT and chest radiograph, 7th 22 11  Findings: The cardiac silhouette is normal in size and configuration.  The mediastinum is normal in contour caliber.  No hilar masses.  Stable right upper lobe nodule, benign.  The lungs are clear.  No pleural effusion or pneumothorax.  The bony thorax is demineralized but grossly intact.  IMPRESSION: No acute cardiopulmonary disease.   Original Report Authenticated By: Amie Portland, M.D.    PHYSICAL EXAM  patient is alert. Lungs are clear. Respiratory effort is not labored. Cardiac exam reveals S1 and S2. The cath site at her right radial is stable.   TELEMETRY: I have reviewed telemetry today March 31, 2013. There is normal sinus rhythm.   ASSESSMENT AND PLAN:    NSTEMI (non-ST elevated  myocardial infarction)     At this point the patient's overall cardiac status appears to be quite stable. The exact etiology of her discomfort is not clear. She is a difficult historian. However the troponins did show an abnormal trend. Left ventricular function does not suggest a Takotsubo event. She will receive a GI cocktail this morning. I have ordered another troponin to see further trend. I also ordered CPK-MB to see if this gives is any further information. Based on this information we will decide if she can be discharged from the hospital.    Hyperlipidemia   Willa Rough 03/31/2013 8:19 AM

## 2013-03-31 NOTE — Clinical Social Work Psychosocial (Signed)
Clinical Social Work Department BRIEF PSYCHOSOCIAL ASSESSMENT 03/31/2013  Patient:  Sharon [name], Sharon [name]     Account Number:  [id number]     Admit date:  03/28/2013  Clinical Social Worker:  Delmer Islam  Date/Time:  03/31/2013 03:02 AM  Referred by:  Physician  Date Referred:  03/30/2013 Referred for  SNF Placement   Other Referral:   Interview type:  Family Other interview type:   CSW also talked with admissions director Sharon [name] at [facility] regarding patient returning today.    PSYCHOSOCIAL DATA Living Status:  FACILITY Admitted from facility:  [facility] Level of care:  Skilled Nursing Facility Primary support name:  Sharon [name] Primary support relationship to patient:  CHILD, ADULT Degree of support available:   Son at the bedside to transport patient back to facility.    CURRENT CONCERNS Current Concerns  Post-Acute Placement   Other Concerns:    SOCIAL WORK ASSESSMENT / PLAN CSW advised by RN that patient ready to return to SNF today (03/31/13). CSW spoke with patient and son advising that d/c paperwork ready and would be provided to them by bedside nurse.   Assessment/plan status:  No Further Intervention Required Other assessment/ plan:   Information/referral to community resources:    PATIENT'S/FAMILY'S RESPONSE TO PLAN OF CARE: Patient and son expressed appreciation for CSW's assistance. Son will be transporting patient back to SNF.

## 2013-03-31 NOTE — Discharge Summary (Signed)
CARDIOLOGY DISCHARGE SUMMARY   Patient ID: Sharon [name] MRN: [id number] DOB/AGE: January 23, 1933 79 y.o.  Admit date: 03/28/2013 Discharge date: 03/31/2013  Primary Discharge Diagnosis:     NSTEMI (non-ST elevated myocardial infarction) Secondary Discharge Diagnosis:    Dementia with behavioral disturbance   Chronic pain   Essential hypertension, benign   Hyperlipidemia   GERD   Procedures:  Left Heart Cath, Selective Coronary Angiography, LV angiography   Hospital Course: Sharon [name] is a 79 y.o. female with no documented history of CAD. She was admitted with chest pain on 8/23.  Her troponin's were elevated, so she was cathed on 8/25. Results are below, no significant CAD was seen. Her EF was normal by echo, with no Takotsubo physiology noted. No further cardiac workup was indicated. A lipid profile was performed and showed an elevated total cholesterol and an elevated LDL. Because her workup for CAD was negative, we will leave the decision on a statin to her primary care physician.  She has a history of non-cardiac chest pain and GERD. On 8/26, she was still having some pain. She was given a GI cocktail which may have helped. The patient appeared to be comfortable and stated she was feeling better but still complaining of 9/10 pain. Consideration can be given to using Tylenol or Mylanta for this pain. No further cardiac evaluation is indicated.  She had mild hypokalemia after the cath and was supplemented.   On 03/31/2013, Sharon [name] was seen by Dr. Myrtis Ser and all available data were reviewed. Her pain is atypical and not associated with coronary artery disease or coronary vaso-spasm. She had some elevation in her cardiac enzymes which then normalized. The cause for this is unclear. She seems to have improved with a GI cocktail but continues to complain of pain despite a negative workup. Dr. Myrtis Ser feels there is no cardiac cause for her symptoms and no further inpatient evaluation is  indicated.   She is considered stable for discharge, to treat symptoms with when necessary medications and follow up with primary care.  Labs:   Lab Results  Component Value Date   WBC 6.2 03/31/2013   HGB 11.8* 03/31/2013   HCT 34.6* 03/31/2013   MCV 85.0 03/31/2013   PLT 114* 03/31/2013    Recent Labs Lab 03/28/13 1327  03/31/13 0400  NA 144  < > 142  K 3.9  < > 3.3*  CL 109  < > 108  CO2 23  < > 24  BUN 14  < > 10  CREATININE 1.17*  < > 1.01  CALCIUM 9.0  < > 8.8  PROT 6.7  --   --   BILITOT 0.4  --   --   ALKPHOS 91  --   --   ALT 63*  --   --   AST 74*  --   --   GLUCOSE 93  < > 71  < > = values in this interval not displayed.  Recent Labs  03/28/13 1831 03/28/13 2358 03/31/13 1040  CKTOTAL  --   --  105  CKMB  --   --  2.8  TROPONINI 1.11* 1.30* <0.30   Lipid Panel     Component Value Date/Time   CHOL 206* 03/28/2013 1831   TRIG 55 03/28/2013 1831   HDL 67 03/28/2013 1831   CHOLHDL 3.1 03/28/2013 1831   VLDL 11 03/28/2013 1831   LDLCALC 128* 03/28/2013 1831    Pro B Natriuretic peptide (BNP)  Date/Time Value  Range Status  03/28/2013  1:27 PM 129.6  0 - 450 pg/mL Final  10/28/2009 12:40 PM <30.0  0.0 - 100.0 pg/mL Final    Recent Labs  03/30/13 1004  INR 1.02      Radiology: Dg Chest Port 1 View 03/28/2013   *RADIOLOGY REPORT*  Clinical Data: Chest pain  PORTABLE CHEST - 1 VIEW  Comparison: Chest CT and chest radiograph, 7th 22 11  Findings: The cardiac silhouette is normal in size and configuration.  The mediastinum is normal in contour caliber.  No hilar masses.  Stable right upper lobe nodule, benign.  The lungs are clear.  No pleural effusion or pneumothorax.  The bony thorax is demineralized but grossly intact.  IMPRESSION: No acute cardiopulmonary disease.   Original Report Authenticated By: Amie Portland, M.D.    Cardiac Cath:  03/30/2013 Left mainstem: Normal  Left anterior descending (LAD): Mild irregularities in the proximal vessel less than 20%.   Ramus intermediate with mild irregularities.  Left circumflex (LCx): Normal.  Right coronary artery (RCA): Normal.  Left ventriculography: Left ventricular systolic function is normal, LVEF is estimated at 65%, there is no significant mitral regurgitation  Final Conclusions:  1. No significant CAD  2. Normal LV function  EKG:  SR, no ST elevation  Echo:  03/29/2013 Study Conclusions Left ventricle: The cavity size was normal. Wall thickness was increased in a pattern of mild LVH. Systolic function was normal. The estimated ejection fraction was in the range of 60% to 65%. Wall motion was normal; there were no regional wall motion abnormalities. Doppler parameters are consistent with abnormal left ventricular relaxation (grade 1 diastolic dysfunction).   FOLLOW UP PLANS AND APPOINTMENTS Allergies  Allergen Reactions  . Ultram [Tramadol]     Listed on [facility]     Medication List         amLODipine 2.5 MG tablet  Commonly known as:  NORVASC  Take 2.5 mg by mouth daily.     calcium & magnesium carbonates 311-232 MG per tablet  Commonly known as:  MYLANTA  Take 1 tablet by mouth 3 (three) times daily as needed for heartburn.     calcium-vitamin D 500-200 MG-UNIT per tablet  Commonly known as:  OSCAL WITH D  Take 1 tablet by mouth daily.     diclofenac sodium 1 % Gel  Commonly known as:  VOLTAREN  Apply 2 g topically 4 (four) times daily. To right knee     divalproex 250 MG DR tablet  Commonly known as:  DEPAKOTE  Take 250 mg by mouth at bedtime.     donepezil 10 MG tablet  Commonly known as:  ARICEPT  Take 10 mg by mouth at bedtime.     HYDROcodone-acetaminophen 5-325 MG per tablet  Commonly known as:  NORCO/VICODIN  Take one tablet by mouth three times a day     NAMENDA XR PO  Take 5 mg by mouth 2 (two) times daily.     nitroGLYCERIN 0.4 MG SL tablet  Commonly known as:  NITROSTAT  Place 0.4 mg under the tongue every 5 (five) minutes as needed for chest pain.       omeprazole 40 MG capsule  Commonly known as:  PRILOSEC  Take 40 mg by mouth daily.     pregabalin 50 MG capsule  Commonly known as:  LYRICA  Take 1 capsule (50 mg total) by mouth 2 (two) times daily.        Discharge Orders   Future  Appointments Provider Department Dept Phone   05/25/2013 12:00 PM Luis Abed, MD [place] Main Office [place]) [phone]   Future Orders Complete By Expires   Diet - low sodium heart healthy  As directed    Increase activity slowly  As directed      Follow-up Information   Follow up with Willa Rough, MD On 05/25/2013. (at noon.)    Specialty:  Cardiology   Contact information:   [address] [place] [address] [phone]       BRING ALL MEDICATIONS WITH YOU TO FOLLOW UP APPOINTMENTS  Time spent with patient to include physician time: 43 min Signed: Theodore Demark, PA-C 03/31/2013, 2:37 PM Patient seen and examined. I agree with the assessment and plan as detailed above. See also my additional thoughts below.   Please refer to my progress note from earlier today. Troponins have normalized. At this time we do not know the exact etiology of the troponin rise. However her overall cardiac status is stable. Unfortunately she still has some mild intermittent chest discomfort that is not explained. GI cocktail seemed to help some. No further cardiac workup.  Willa Rough, MD, [facility] 03/31/2013 3:07 PM

## 2013-04-01 ENCOUNTER — Non-Acute Institutional Stay (SKILLED_NURSING_FACILITY): Payer: Medicare Other | Admitting: Adult Health

## 2013-04-01 ENCOUNTER — Encounter: Payer: Self-pay | Admitting: Adult Health

## 2013-04-01 DIAGNOSIS — M940 Chondrocostal junction syndrome [Tietze]: Secondary | ICD-10-CM

## 2013-04-01 MED ORDER — DICLOFENAC SODIUM 1 % TD GEL: 2.0000 g | Freq: Four times a day (QID) | TRANSDERMAL | Status: DC

## 2013-04-01 NOTE — Progress Notes (Signed)
Patient ID: Sharon [name], female   DOB: [birth year], 79 y.o.   MRN: [id number]  [facility]  Allergies  Allergen Reactions  . Ultram [Tramadol]     Listed on [facility]    Chief Complaint  Patient presents with  . Acute Visit    follow up chest pain     HPI: Pt is being followed for medical management of chronic illnesses. Pt endorses acute on chronic intermittent R sided CP. Pt  characterizes pain as an ache with associated SOB.  Pt. Reports CP is relieved by rest/relaxation/ and analgesic.  Pt further endorses having CP while carrying a heavy pocket book in the R hand.  Further pt reports R sided CP when placing heavy items in the upper section of closet.  Pt denies current CP a present. Pt and nursing staff do not voice further concerns/ issues.     Past Medical History  Diagnosis Date  . Emphysema   . Lung nodule   . Diverticulosis of colon   . Dysphagia   . Peptic stricture of esophagus   . Acute myocardial infarction   . CAD (coronary artery disease)     No past surgical history on file.  VITAL SIGNS BP 138/62  Pulse 62  Ht 5\' 5"  (1.651 m)  Wt 160 lb (72.576 kg)  BMI 26.63 kg/m2   Patient's Medications  New Prescriptions   No medications on file  Previous Medications   AMLODIPINE (NORVASC) 2.5 MG TABLET    Take 2.5 mg by mouth daily.   CALCIUM & MAGNESIUM CARBONATES (MYLANTA) 311-232 MG PER TABLET    Take 1 tablet by mouth 3 (three) times daily as needed for heartburn.   CALCIUM-VITAMIN D (OSCAL WITH D) 500-200 MG-UNIT PER TABLET    Take 1 tablet by mouth daily.   DICLOFENAC SODIUM (VOLTAREN) 1 % GEL    Apply 2 g topically 4 (four) times daily. To right knee   DIVALPROEX (DEPAKOTE) 250 MG DR TABLET    Take 250 mg by mouth at bedtime.   DONEPEZIL (ARICEPT) 10 MG TABLET    Take 10 mg by mouth at bedtime.   HYDROCODONE-ACETAMINOPHEN (NORCO/VICODIN) 5-325 MG PER TABLET    Take one tablet by mouth three times a day   MEMANTINE HCL (NAMENDA XR PO)    Take 5 mg by mouth 2  (two) times daily.   NITROGLYCERIN (NITROSTAT) 0.4 MG SL TABLET    Place 0.4 mg under the tongue every 5 (five) minutes as needed for chest pain.   OMEPRAZOLE (PRILOSEC) 40 MG CAPSULE    Take 40 mg by mouth daily.   PREGABALIN (LYRICA) 50 MG CAPSULE    Take 1 capsule (50 mg total) by mouth 2 (two) times daily.  Modified Medications   No medications on file  Discontinued Medications   No medications on file    SIGNIFICANT DIAGNOSTIC EXAMS    LABS REVIEWED:  Troponin 0.30, Total CK 105, CK MB 2.8, Relative Index 2.7, Na+ 142, K+ 3.3, Cl 108, CO2 24, Glucose 71, BUN 10, Creatinine 1.01, Calcium 8.8, GFR non AA 52, GFR 60, WBC 6.2, RBC 4.07, Hgb 11.8, Hct 34.6, MCV 85, MCH 29, MCHC 34.1, RDW 14.1, [place] 114    Review of Systems  Constitutional: Negative.   HENT: Negative.   Eyes: Negative.   Respiratory: Positive for shortness of breath.        Associated with R sided CP  Cardiovascular: Positive for chest pain. Negative for palpitations.  Acute on Chronic intermittent R sided CP with lifting heavy objects.  Gastrointestinal: Negative.   Genitourinary: Negative.   Skin: Negative.   Neurological: Negative.   Endo/Heme/Allergies: Negative.   Psychiatric/Behavioral: Positive for memory loss.       H/o Dementia    Physical Exam  Constitutional: She is oriented to person, place, and time. She appears well-developed and well-nourished.  HENT:  Head: Normocephalic.  Neck: Normal range of motion.  Cardiovascular: Regular rhythm and normal heart sounds.  PMI is not displaced.   Pulses:      Radial pulses are 2+ on the right side, and 2+ on the left side.  R wrist cath site level 0  Respiratory: Effort normal and breath sounds normal.  Musculoskeletal: Normal range of motion.  Reproducible CP noted during palpation of R sided Chest Wall   Neurological: She is alert and oriented to person, place, and time.  Short-term memory deficits  Skin: Skin is warm and dry.   Psychiatric: She has a normal mood and affect. Her behavior is normal. Judgment and thought content normal.      ASSESSMENT/ PLAN:  DiclofenacSodium (Voltaren) 1% gel Apply to affected R side chest three times a day BMET in one week Continue to monitor and evaluate effectiveness of treatment plan

## 2013-04-03 LAB — CULTURE, BLOOD (ROUTINE X 2): Culture: NO GROWTH

## 2013-04-10 ENCOUNTER — Non-Acute Institutional Stay (SKILLED_NURSING_FACILITY): Payer: Medicare Other | Admitting: Internal Medicine

## 2013-04-10 DIAGNOSIS — K219 Gastro-esophageal reflux disease without esophagitis: Secondary | ICD-10-CM

## 2013-04-10 DIAGNOSIS — G609 Hereditary and idiopathic neuropathy, unspecified: Secondary | ICD-10-CM

## 2013-04-10 DIAGNOSIS — F0280 Dementia in other diseases classified elsewhere without behavioral disturbance: Secondary | ICD-10-CM

## 2013-04-10 DIAGNOSIS — I10 Essential (primary) hypertension: Secondary | ICD-10-CM

## 2013-04-13 ENCOUNTER — Other Ambulatory Visit: Payer: Self-pay | Admitting: *Deleted

## 2013-04-13 MED ORDER — PREGABALIN 50 MG PO CAPS: 50.0000 mg | ORAL_CAPSULE | Freq: Two times a day (BID) | ORAL | Status: DC

## 2013-04-17 ENCOUNTER — Non-Acute Institutional Stay (SKILLED_NURSING_FACILITY): Payer: Medicare Other | Admitting: Internal Medicine

## 2013-04-17 DIAGNOSIS — N189 Chronic kidney disease, unspecified: Secondary | ICD-10-CM

## 2013-04-20 ENCOUNTER — Other Ambulatory Visit: Payer: Self-pay | Admitting: *Deleted

## 2013-04-20 MED ORDER — HYDROCODONE-ACETAMINOPHEN 5-325 MG PO TABS: ORAL_TABLET | ORAL | Status: DC

## 2013-04-30 ENCOUNTER — Non-Acute Institutional Stay (SKILLED_NURSING_FACILITY): Payer: Medicare Other | Admitting: Internal Medicine

## 2013-05-01 DIAGNOSIS — M1710 Unilateral primary osteoarthritis, unspecified knee: Secondary | ICD-10-CM | POA: Insufficient documentation

## 2013-05-01 NOTE — Progress Notes (Signed)
PROGRESS NOTE  DATE: 04/30/2013  FACILITY:  [facility]  LEVEL OF CARE: SNF (31)  Acute Visit  CHIEF COMPLAINT:  Manage bilateral knee pain  HISTORY OF PRESENT ILLNESS: I was requested by the staff to assess the patient regarding above problem(s):  Patient's son requested me to assess the patient and speak to him he regarding her knee pains. Patient is complaining of ongoing bilateral knee pains. She refuses work on general. She is on scheduled Novacor 3 times a day. She is also wearing knee braces. Patient denies radiation of pain, numbness tingling or weakness in her lower extremities. Range of motion exacerbates pain.  PAST MEDICAL HISTORY : Reviewed.  No changes.  CURRENT MEDICATIONS: Reviewed per [facility]  REVIEW OF SYSTEMS:  GENERAL: no change in appetite, no fatigue, no weight changes, no fever, chills or weakness RESPIRATORY: no cough, SOB, DOE,, wheezing, hemoptysis CARDIAC: no chest pain, edema or palpitations GI: no abdominal pain, diarrhea, constipation, heart burn, nausea or vomiting  PHYSICAL EXAMINATION  VS:  T 97.2        P 70       RR 18      BP 120/70      POX %       WT (Lb)  GENERAL: no acute distress, normal body habitus NECK: supple, trachea midline, no neck masses, no thyroid tenderness, no thyromegaly RESPIRATORY: breathing is even & unlabored, BS CTAB CARDIAC: RRR, no murmur,no extra heart sounds, +2 bilateral lower extremity edema GI: abdomen soft, normal BS, no masses, no tenderness, no hepatomegaly, no splenomegaly PSYCHIATRIC: the patient is alert & oriented to person, affect & behavior appropriate MUSCULOSKELETAL: Bilateral knees edematous and exquisitely tender to palpation. There is some warmth but no erythema.  ASSESSMENT/PLAN:  Bilateral knee osteoarthritis-uncontrolled. Spoke with Son.  Will obtain orthopedic consultation per his request.  CPT CODE: 29562

## 2013-05-07 NOTE — Progress Notes (Signed)
Patient ID: Sharon [name], female   DOB: 11/15/1932, 79 y.o.   MRN: [id number]        HISTORY & PHYSICAL  DATE: 04/10/2013   FACILITY: [facility]  LEVEL OF CARE: SNF (31)  ALLERGIES:  Allergies  Allergen Reactions  . Ultram [Tramadol]     Listed on [facility]    CHIEF COMPLAINT:  Manage dementia, hypertension, and GERD.    HISTORY OF PRESENT ILLNESS:  The patient is a 79 year-old, African-American female who was recently hospitalized and readmitted back to the facility for long-term care management.  She has the following problems:    DEMENTIA: The dementia remains stable and continues to function adequately in the current living environment with supervision.  The patient has had little changes in behavior. No complications noted from the medications presently being used.    HTN: Pt 's HTN remains stable.  Denies CP, sob, DOE, pedal edema, headaches, dizziness or visual disturbances.  No complications from the medications currently being used.  Last BP :  120/60.    GERD: pt's GERD is stable.  Denies ongoing heartburn, abd. Pain, nausea or vomiting.  Currently on a PPI & tolerates it without any adverse reactions.    PAST MEDICAL HISTORY :  Past Medical History  Diagnosis Date  . Emphysema   . Lung nodule   . Diverticulosis of colon   . Dysphagia   . Peptic stricture of esophagus   . Acute myocardial infarction   . CAD (coronary artery disease)    Hypertension.    Dementia.     GERD.    Peripheral neuropathy.    Chronic pain.    PAST SURGICAL HISTORY: None  SOCIAL HISTORY: TOBACCO USE:  She was a former smoker.   ALCOHOL:  Denies alcohol use.   ILLICIT DRUGS:  Denies illicit drug use.   HOUSING:  She was a resident of this facility prior to hospitalization.    FAMILY HISTORY:  MOTHER:  Mother had coronary artery disease.   FATHER:  Father had diabetes and a stroke.    CURRENT MEDICATIONS: Reviewed per [facility]  REVIEW OF SYSTEMS:  See HPI otherwise  14 point ROS is negative.  PHYSICAL EXAMINATION  VS:  T 98.4       P 70      RR 18      BP 120/60      POX%        WT (Lb) 155  GENERAL: no acute distress, normal body habitus EYES: conjunctivae normal, sclerae normal, normal eye lids MOUTH/THROAT: lips without lesions,no lesions in the mouth,tongue is without lesions,uvula elevates in midline NECK: supple, trachea midline, no neck masses, no thyroid tenderness, no thyromegaly LYMPHATICS: no LAN in the neck, no supraclavicular LAN RESPIRATORY: breathing is even & unlabored, BS CTAB CARDIAC: RRR, no murmur,no extra heart sounds, no edema GI:  ABDOMEN: abdomen soft, normal BS, no masses, no tenderness  LIVER/SPLEEN: no hepatomegaly, no splenomegaly MUSCULOSKELETAL: HEAD: normal to inspection & palpation BACK: no kyphosis, scoliosis or spinal processes tenderness EXTREMITIES: LEFT UPPER EXTREMITY: full range of motion, normal strength & tone RIGHT UPPER EXTREMITY:  full range of motion, normal strength & tone LEFT LOWER EXTREMITY: strength intact, range of motion moderate   RIGHT LOWER EXTREMITY: strength intact, range of motion moderate   PSYCHIATRIC: the patient is alert & oriented to person, affect & behavior appropriate  LABS/RADIOLOGY: 11/2012:  TSH 0.977.    Depakote level 50.    Fasting lipid  panel normal.    Total protein 5.9, otherwise liver profile normal.    ASSESSMENT/PLAN:  Dementia.  Stable.    Hypertension.  Well controlled.    GERD.  Well controlled.     Peripheral neuropathy.  Continue Lyrica.    Chronic pain.  Denies ongoing pain.  Continue pain medications.    Check CBC and BMP.    I have reviewed patient's medical records received at admission/from hospitalization.  CPT CODE: 78469

## 2013-05-15 DIAGNOSIS — N189 Chronic kidney disease, unspecified: Secondary | ICD-10-CM | POA: Insufficient documentation

## 2013-05-15 NOTE — Progress Notes (Signed)
Patient ID: Sharon [name], female   DOB: Nov 05, 1932, 79 y.o.   MRN: [id number]        PROGRESS NOTE  DATE: 04/17/2013  FACILITY:  [facility]  LEVEL OF CARE: SNF (31)  Acute Visit  CHIEF COMPLAINT:  Manage chronic kidney disease.    HISTORY OF PRESENT ILLNESS: I was requested by the staff to assess the patient regarding above problem(s):  CHRONIC KIDNEY DISEASE: The patient's chronic kidney disease remains stable.  Patient denies increasing lower extremity swelling or confusion. Last BUN and creatinine are:   On 04/14/2013:  BUN 11, creatinine 1.02.  In 11/2012:  Creatinine 1.08.    PAST MEDICAL HISTORY : Reviewed.  No changes.  CURRENT MEDICATIONS: Reviewed per [facility]  PHYSICAL EXAMINATION  GENERAL: no acute distress, normal body habitus RESPIRATORY: breathing is even & unlabored, BS CTAB CARDIAC: RRR, no murmur,no extra heart sounds EDEMA/VARICOSITIES: left lower extremity has +1 edema, right lower extremity has no edema    ASSESSMENT/PLAN:  Chronic kidney disease.  Renal functions improved.  We will monitor.     CPT CODE: 78295

## 2013-05-21 ENCOUNTER — Encounter: Payer: Self-pay | Admitting: Cardiology

## 2013-05-21 ENCOUNTER — Non-Acute Institutional Stay (SKILLED_NURSING_FACILITY): Payer: Medicare Other | Admitting: Internal Medicine

## 2013-05-21 DIAGNOSIS — I150 Renovascular hypertension: Secondary | ICD-10-CM

## 2013-05-21 DIAGNOSIS — K5730 Diverticulosis of large intestine without perforation or abscess without bleeding: Secondary | ICD-10-CM | POA: Insufficient documentation

## 2013-05-21 DIAGNOSIS — R911 Solitary pulmonary nodule: Secondary | ICD-10-CM | POA: Insufficient documentation

## 2013-05-21 DIAGNOSIS — R7989 Other specified abnormal findings of blood chemistry: Secondary | ICD-10-CM

## 2013-05-21 DIAGNOSIS — J439 Emphysema, unspecified: Secondary | ICD-10-CM | POA: Insufficient documentation

## 2013-05-21 DIAGNOSIS — G609 Hereditary and idiopathic neuropathy, unspecified: Secondary | ICD-10-CM

## 2013-05-21 DIAGNOSIS — F0390 Unspecified dementia without behavioral disturbance: Secondary | ICD-10-CM

## 2013-05-21 DIAGNOSIS — R9430 Abnormal result of cardiovascular function study, unspecified: Secondary | ICD-10-CM | POA: Insufficient documentation

## 2013-05-21 DIAGNOSIS — N189 Chronic kidney disease, unspecified: Secondary | ICD-10-CM

## 2013-05-23 DIAGNOSIS — I150 Renovascular hypertension: Secondary | ICD-10-CM | POA: Insufficient documentation

## 2013-05-23 NOTE — Progress Notes (Signed)
PROGRESS NOTE  DATE: 05-19-13  FACILITY: Nursing Home Location: [facility]  LEVEL OF CARE: SNF (31)  Routine Visit  CHIEF COMPLAINT:  Manage CKD, GERD, hypertension, dementia and peripheral neuropathy  HISTORY OF PRESENT ILLNESS:  REASSESSMENT OF ONGOING PROBLEM(S):  CHRONIC KIDNEY DISEASE: The patient's chronic kidney disease remains stable.  Patient denies increasing lower extremity swelling or confusion. Last BUN and creatinine are: on 05-14-13 bun 11, cr 1.02.  In 9-14 c4 1.02.  HTN: Pt 's HTN remains stable. Denies CP, sob, DOE, pedal edema, headaches, dizziness or visual disturbances.  No complications from the medications currently being used.  Last BP : 130/78, 118/78, 128/88, 128/70.  DEMENTIA: The dementia remaines stable and continues to function adequately in the current living environment with supervision.  The patient has had little changes in behavior. No complications noted from the medications presently being used.  PERIPHERAL NEUROPATHY: The peripheral neuropathy is stable. The patient denies pain in the feet, tingling, and numbness. No complications noted from the medication presently being used.  GERD: pt's GERD is stable.  Complains of heartburn, but denies abd. Pain, nausea or vomiting.  Currently not on a PPI.  PAST MEDICAL HISTORY : Reviewed.  No changes.  CURRENT MEDICATIONS: Reviewed per [facility]  REVIEW OF SYSTEMS:  GENERAL: no change in appetite, no fatigue, no weight changes, no fever, chills or weakness RESPIRATORY: no cough, SOB, DOE, wheezing, hemoptysis CARDIAC: no chest pain, edema or palpitations GI: no abdominal pain, diarrhea, constipation, nausea or vomiting.  Positive heartburn.  PHYSICAL EXAMINATION  VS:  T 97       P 86      RR 18      BP 128/70     POX %     WT (Lb) 172  GENERAL: no acute distress, normal body habitus EYES: Normal sclerae, normal conjunctivae, no discharge NECK: supple, trachea midline, no neck  masses, no thyroid tenderness, no thyromegaly LYMPHATICS: No cervical lymphadenopathy, no supraclavicular lymphadenopathy in the RESPIRATORY: breathing is even & unlabored, BS CTAB CARDIAC: RRR, no murmur,no extra heart sounds, +1 bilateral lower extremity edema GI: abdomen soft, normal BS, no masses, no tenderness, no hepatomegaly, no splenomegaly PSYCHIATRIC: the patient is alert & oriented to person, affect & behavior appropriate  LABS/RADIOLOGY:  10-14 cbc nl, cr 1.02, AP 118 ow cmp nl  4/14 CBC normal, creatinine 1.08, total protein 5.9 otherwise CMP normal, FLP normal, TSH 0.977, Depakote level 50  ASSESSMENT/PLAN:  CKD- renal fcns stable.  Will monitor. Hypertension-well-controlled. peripheral neuropathy-continue Lyrica. dementia-stable. GERD-stable. right knee OA-cont. Pain meds.  Orthopedic consult per son's request is pending. Check depakote level.  CPT CODE: 16109

## 2013-05-25 ENCOUNTER — Ambulatory Visit (INDEPENDENT_AMBULATORY_CARE_PROVIDER_SITE_OTHER): Payer: Medicare Other | Admitting: Cardiology

## 2013-05-25 ENCOUNTER — Encounter: Payer: Self-pay | Admitting: Cardiology

## 2013-05-25 VITALS — BP 134/68 | HR 65 | Ht 66.0 in | Wt 173.0 lb

## 2013-05-25 DIAGNOSIS — R079 Chest pain, unspecified: Secondary | ICD-10-CM

## 2013-05-25 DIAGNOSIS — I10 Essential (primary) hypertension: Secondary | ICD-10-CM

## 2013-05-25 NOTE — Assessment & Plan Note (Signed)
Blood pressure is controlled. No change in therapy. 

## 2013-05-25 NOTE — Progress Notes (Signed)
HPI  Patient is seen today to followup hospitalization that occurred in August, 2014. At that time she had presented with some elevation of troponins. Echo showed normal wall motion. It was felt that catheterization was necessary. This was done and there was no evidence of significant coronary artery disease. It was felt that possibly she had had a Takotsubo event with rapid normalization of her LV function.  As part of today's evaluation I have reviewed the hospital records. She lives in a nursing home. She is not having any significant chest pain or shortness of breath.  Allergies  Allergen Reactions  . Ultram [Tramadol]     Listed on [facility]    Current Outpatient Prescriptions  Medication Sig Dispense Refill  . amLODipine (NORVASC) 2.5 MG tablet Take 2.5 mg by mouth daily.      . calcium & magnesium carbonates (MYLANTA) 311-232 MG per tablet Take 1 tablet by mouth 3 (three) times daily as needed for heartburn.      . calcium-vitamin D (OSCAL WITH D) 500-200 MG-UNIT per tablet Take 1 tablet by mouth daily.      . diclofenac sodium (VOLTAREN) 1 % GEL Apply 2 g topically 4 (four) times daily. To right knee ( R anterior chest wall x 2 weeks)  1 Tube  12  . divalproex (DEPAKOTE) 250 MG DR tablet Take 250 mg by mouth at bedtime.      . donepezil (ARICEPT) 10 MG tablet Take 10 mg by mouth at bedtime.      Marland Kitchen HYDROcodone-acetaminophen (NORCO/VICODIN) 5-325 MG per tablet Take one tablet by mouth three times a day  90 tablet  5  . Memantine HCl (NAMENDA XR PO) Take 5 mg by mouth 2 (two) times daily.      . nitroGLYCERIN (NITROSTAT) 0.4 MG SL tablet Place 0.4 mg under the tongue every 5 (five) minutes as needed for chest pain.      Marland Kitchen omeprazole (PRILOSEC) 40 MG capsule Take 40 mg by mouth daily.      . pregabalin (LYRICA) 50 MG capsule Take 1 capsule (50 mg total) by mouth 2 (two) times daily.  60 capsule  5   No current facility-administered medications for this visit.    History   Social  History  . Marital Status: Single    Spouse Name: N/A    Number of Children: N/A  . Years of Education: N/A   Occupational History  . Not on file.   Social History Main Topics  . Smoking status: Former Games developer  . Smokeless tobacco: Not on file  . Alcohol Use: No  . Drug Use: No  . Sexual Activity: Not on file   Other Topics Concern  . Not on file   Social History Narrative  . No narrative on file    No family history on file.  Past Medical History  Diagnosis Date  . Emphysema   . Lung nodule   . Diverticulosis of colon   . Dysphagia   . Peptic stricture of esophagus   . Ejection fraction   . Chest pain     History reviewed. No pertinent past surgical history.  Patient Active Problem List   Diagnosis Date Noted  . Chest pain   . Secondary renovascular hypertension, benign 05/23/2013  . Elevated troponin 05/21/2013  . Emphysema   . Lung nodule   . Diverticulosis of colon   . Ejection fraction   . Chronic kidney disease, unspecified 05/15/2013  . Unspecified arthropathy, lower  leg 05/01/2013  . Hyperlipidemia 03/28/2013  . Unspecified hereditary and idiopathic peripheral neuropathy 12/22/2012  . Senile dementia, uncomplicated 12/22/2012  . Essential hypertension, benign 12/16/2012  . Chronic pain 11/27/2012  . GERD 07/12/2008    ROS   Patient denies fever, chills, headache, sweats, rash, change in vision, change in hearing, chest pain, cough, nausea or vomiting, urinary symptoms. All other systems are reviewed and are negative.  PHYSICAL EXAM  Patient is oriented to person time and place. Affect is normal. She was brought here by an attendant from the nursing home. She is in a wheelchair. There is no jugulovenous distention. Lungs are clear. Respiratory effort is nonlabored. Cardiac exam reveals S1-S2. There no clicks or significant murmurs. There is no significant peripheral edema. There are no musculoskeletal deformities. There are no skin rashes.  Filed  Vitals:   05/25/13 1219  BP: 134/68  Pulse: 65  Height: 5\' 6"  (1.676 m)  Weight: 173 lb (78.472 kg)  SpO2: 98%     ASSESSMENT & PLAN

## 2013-05-25 NOTE — Patient Instructions (Signed)
Your physician recommends that you continue on your current medications as directed. Please refer to the Current Medication list given to you today.  Your physician recommends that you schedule a follow-up appointment in: as needed  

## 2013-05-25 NOTE — Assessment & Plan Note (Signed)
The patient is not having any recurrent chest pain. We know she has good left ventricular function. Her catheterization showed no significant coronary disease in August, 2014. At this time her cardiac status is stable. No further cardiac workup is needed at this time. The patient does not need followup cardiology evaluations unless there is a change in her status.  As part of today's evaluation I spent greater than 25 minutes with her total care. More than half of this time has been with direct contact fully reviewing the patient's overall status.

## 2013-10-12 ENCOUNTER — Other Ambulatory Visit: Payer: Self-pay | Admitting: *Deleted

## 2013-10-12 MED ORDER — PREGABALIN 50 MG PO CAPS: ORAL_CAPSULE | ORAL | Status: DC

## 2013-10-12 NOTE — Telephone Encounter (Signed)
[facility] 

## 2013-10-26 ENCOUNTER — Other Ambulatory Visit: Payer: Self-pay | Admitting: *Deleted

## 2013-10-26 MED ORDER — HYDROCODONE-ACETAMINOPHEN 5-325 MG PO TABS: ORAL_TABLET | ORAL | Status: DC

## 2013-10-26 NOTE — Telephone Encounter (Signed)
[facility] 

## 2013-11-03 ENCOUNTER — Non-Acute Institutional Stay (SKILLED_NURSING_FACILITY): Payer: Medicare Other | Admitting: Adult Health

## 2013-11-03 DIAGNOSIS — M1990 Unspecified osteoarthritis, unspecified site: Secondary | ICD-10-CM

## 2013-11-03 DIAGNOSIS — I150 Renovascular hypertension: Secondary | ICD-10-CM

## 2013-11-03 DIAGNOSIS — J438 Other emphysema: Secondary | ICD-10-CM

## 2013-11-03 DIAGNOSIS — J439 Emphysema, unspecified: Secondary | ICD-10-CM

## 2013-11-03 DIAGNOSIS — F0390 Unspecified dementia without behavioral disturbance: Secondary | ICD-10-CM

## 2013-11-03 DIAGNOSIS — K219 Gastro-esophageal reflux disease without esophagitis: Secondary | ICD-10-CM

## 2013-11-03 DIAGNOSIS — G8929 Other chronic pain: Secondary | ICD-10-CM

## 2013-11-07 ENCOUNTER — Encounter: Payer: Self-pay | Admitting: Adult Health

## 2013-11-07 DIAGNOSIS — F0390 Unspecified dementia without behavioral disturbance: Secondary | ICD-10-CM | POA: Insufficient documentation

## 2013-11-07 DIAGNOSIS — M1990 Unspecified osteoarthritis, unspecified site: Secondary | ICD-10-CM | POA: Insufficient documentation

## 2013-11-07 NOTE — Progress Notes (Signed)
Patient ID: Sharon [name], female   DOB: Feb 09, 1933, 80 y.o.   MRN: [id number]     [facility]  Allergies  Allergen Reactions  . Ultram [Tramadol]     [facility]     Chief Complaint  Patient presents with  . Medical Managment of Chronic Issues    HPI:  She is being seen for the management of her chronic illnesses. There are no concerns being voiced by the nursing staff at this time. Overall there is no significant change in her status. She is not voicing any concerns at this time.   Past Medical History  Diagnosis Date  . Emphysema   . Lung nodule   . Diverticulosis of colon   . Dysphagia   . Peptic stricture of esophagus   . Ejection fraction   . Chest pain     No past surgical history on file.  VITAL SIGNS BP 132/70  Pulse 60  Ht 5\' 5"  (1.651 m)  Wt 176 lb (79.833 kg)  BMI 29.29 kg/m2   Patient's Medications  New Prescriptions   No medications on file  Previous Medications   AMLODIPINE (NORVASC) 2.5 MG TABLET    Take 2.5 mg by mouth daily.   CALCIUM-VITAMIN D (OSCAL WITH D) 500-200 MG-UNIT PER TABLET    Take 1 tablet by mouth daily.   DIVALPROEX (DEPAKOTE) 250 MG DR TABLET    Take 250 mg by mouth at bedtime.   DONEPEZIL (ARICEPT) 10 MG TABLET    Take 10 mg by mouth at bedtime.   HYDROCODONE-ACETAMINOPHEN (NORCO/VICODIN) 5-325 MG PER TABLET    Take one tablet by mouth three times a day   MEMANTINE HCL (NAMENDA XR PO)    Take 5 mg by mouth 2 (two) times daily.   NITROGLYCERIN (NITROSTAT) 0.4 MG SL TABLET    Place 0.4 mg under the tongue every 5 (five) minutes as needed for chest pain.   OMEPRAZOLE (PRILOSEC) 40 MG CAPSULE    Take 40 mg by mouth daily.   PREGABALIN (LYRICA) 50 MG CAPSULE    Take one capsule by mouth twice daily for pains  Modified Medications   Modified Medication Previous Medication   DICLOFENAC SODIUM (VOLTAREN) 1 % GEL diclofenac sodium (VOLTAREN) 1 % GEL      Apply 2 g topically 4 (four) times daily. To right knee    Apply 2 g  topically 4 (four) times daily. To right knee ( R anterior chest wall x 2 weeks)  Discontinued Medications   CALCIUM & MAGNESIUM CARBONATES (MYLANTA) 311-232 MG PER TABLET    Take 1 tablet by mouth 3 (three) times daily as needed for heartburn.    SIGNIFICANT DIAGNOSTIC EXAMS     LABS REVIEWED:   05-22-13: depakote 17  07-28-13: wbc 6.3; hgb 13.2; hct 38.5; mcv 84; pplt 213; glucose 7; bun 9; creat 0.95; k+4.1;na++146       Review of Systems  Constitutional: Negative for malaise/fatigue.  Respiratory: Negative for cough and shortness of breath.   Cardiovascular: Negative for chest pain and palpitations.  Gastrointestinal: Negative for heartburn, abdominal pain and constipation.  Musculoskeletal: Negative for back pain, joint pain and myalgias.  Skin: Negative.   Psychiatric/Behavioral: The patient is not nervous/anxious.       Physical Exam  Constitutional: She appears well-developed and well-nourished. No distress.  Neck: Neck supple. No JVD present.  Cardiovascular: Normal rate, regular rhythm and intact distal pulses.   Respiratory: Effort normal and breath sounds normal. No respiratory  distress. She has no wheezes.  GI: Soft. Bowel sounds are normal. She exhibits no distension. There is no tenderness.  Musculoskeletal: Normal range of motion. She exhibits no edema.  Neurological: She is alert.  Skin: Skin is warm and dry. She is not diaphoretic.  Psychiatric: She has a normal mood and affect.       ASSESSMENT/ PLAN:  1. Hypertension: is stable will continue norvasc 2.5 mg daily will monitor  2. Gerd: will continue prilosec 40 mg daily   3. Dementia: no change in her status; will continue aricept 10 mg nightly and namenda 5 mg twice daily; depakote 250 mg nightly to help stabilize mood and will monitor  4. Osteoarthritis; is stable will continue voltaren gel 2 gm to right knee four times daily  5. Chronic pain: her pain is presently being managed: will  continue lyrica 50 mg twice daily; vicodin 5/325 mg four times daily and will monitor her status   6. Emphysema: no change in status; is presently not on medications; will monitor her status.        Synthia Innocent NP [facility]  Contact [phone] Monday through Friday 8am- 5pm  After hours call [phone]

## 2013-11-30 ENCOUNTER — Other Ambulatory Visit: Payer: Self-pay | Admitting: *Deleted

## 2013-11-30 MED ORDER — HYDROCODONE-ACETAMINOPHEN 5-325 MG PO TABS: ORAL_TABLET | ORAL | Status: DC

## 2013-11-30 NOTE — Telephone Encounter (Signed)
[facility] 

## 2013-12-01 ENCOUNTER — Other Ambulatory Visit: Payer: Self-pay | Admitting: *Deleted

## 2013-12-01 MED ORDER — HYDROCODONE-ACETAMINOPHEN 5-325 MG PO TABS: ORAL_TABLET | ORAL | Status: DC

## 2013-12-01 NOTE — Telephone Encounter (Signed)
[facility] 

## 2013-12-04 ENCOUNTER — Non-Acute Institutional Stay (SKILLED_NURSING_FACILITY): Payer: Medicare Other | Admitting: Internal Medicine

## 2013-12-04 DIAGNOSIS — M7050 Other bursitis of knee, unspecified knee: Secondary | ICD-10-CM

## 2013-12-04 DIAGNOSIS — M76899 Other specified enthesopathies of unspecified lower limb, excluding foot: Secondary | ICD-10-CM

## 2013-12-08 NOTE — Progress Notes (Addendum)
Patient ID: Magnus SinningMaebell Tangredi, female   DOB: 1933/05/24, 80 y.o.   MRN: [id number]                  PROGRESS NOTE  DATE:  12/04/2013    [facility]    LEVEL OF CARE:   SNF   Acute Visit   CHIEF COMPLAINT:  Pain in the left knee.    HISTORY OF PRESENT ILLNESS:  Mrs. Langston MaskerMorris is a longstanding resident of this facility, dating back to the fall of 2014, I believe.    She has had a right total knee replacement, although I do not currently have any information on that.  She was admitted to the facility in September 2014 on Voltaren to the right knee.  Her major reason for being in long-term care would appear to be dementia with behavioral disturbances.    CURRENT MEDICATIONS:  Medication list is reviewed.     Norvasc 2.5 q.d.    Os-Cal 500/200, 1 tablet daily.    Prilosec 40 q.d.    Namenda 5 twice daily.    Lyrica 50 twice daily.    Voltaren to the right knee.    Aricept 10 q.h.s.    Valproic 250, 1 tablet every night.    REVIEW OF SYSTEMS:   MUSCULOSKELETAL:  She is not complaining of shoulder pain.  She is complaining of pain in the right knee for which she has a soft brace.  The pain she is complaining about appears to be on the lateral aspect of the left knee.  She also thinks there is swelling here.    PHYSICAL EXAMINATION:   MUSCULOSKELETAL:  EXTREMITIES:   LEFT LOWER EXTREMITY:  Left knee:  There is indeed some point tenderness on the posterolateral aspect of the left knee, probably on the medial part of her tibia.  I suspect this is either a bursitis or a tendonitis.  There is some ligamentous instability of the knee itself, especially in the medial and lateral collaterals.  I do not believe there is a joint effusion here.   RIGHT LOWER EXTREMITY:  As mentioned, there is a right total knee replacement.  I do not see a need for Voltaren gel here.    ASSESSMENT/PLAN:  Either a bursitis or a tendonitis on the medial aspect of the left knee.  She does wear a  soft brace on this.  I do not think this has anything to do with it.  I am going to give her a course of NSAIDs.  I do not see any obvious contraindications to a short course of this.  She may need local injections.  However, I will see if oral agents do the job here.

## 2013-12-10 DIAGNOSIS — M7050 Other bursitis of knee, unspecified knee: Secondary | ICD-10-CM | POA: Insufficient documentation

## 2013-12-28 ENCOUNTER — Non-Acute Institutional Stay (SKILLED_NURSING_FACILITY): Payer: Medicare Other | Admitting: Internal Medicine

## 2013-12-28 DIAGNOSIS — I150 Renovascular hypertension: Secondary | ICD-10-CM

## 2013-12-28 DIAGNOSIS — F0390 Unspecified dementia without behavioral disturbance: Secondary | ICD-10-CM

## 2013-12-28 DIAGNOSIS — G609 Hereditary and idiopathic neuropathy, unspecified: Secondary | ICD-10-CM

## 2013-12-28 DIAGNOSIS — K219 Gastro-esophageal reflux disease without esophagitis: Secondary | ICD-10-CM

## 2013-12-28 NOTE — Progress Notes (Signed)
         PROGRESS NOTE  DATE: 12-28-13  FACILITY: Nursing Home Location: [facility]  LEVEL OF CARE: SNF (31)  Routine Visit  CHIEF COMPLAINT:  Manage GERD, hypertension, dementia and peripheral neuropathy  HISTORY OF PRESENT ILLNESS:  REASSESSMENT OF ONGOING PROBLEM(S):  HTN: Pt 's HTN remains stable. Denies CP, sob, DOE, pedal edema, headaches, dizziness or visual disturbances.  No complications from the medications currently being used.  Last BP : 130/78, 118/78, 128/88, 128/70, 140/68.  DEMENTIA: The dementia remaines stable and continues to function adequately in the current living environment with supervision.  The patient has had little changes in behavior. No complications noted from the medications presently being used.  PERIPHERAL NEUROPATHY: The peripheral neuropathy is stable. The patient denies pain in the feet, tingling, and numbness. No complications noted from the medication presently being used.  GERD: pt's GERD is stable.  Complains of heartburn, but denies abd. Pain, nausea or vomiting.  Currently not on a PPI.  PAST MEDICAL HISTORY : Reviewed.  No changes.  CURRENT MEDICATIONS: Reviewed per [facility]  REVIEW OF SYSTEMS:  GENERAL: no change in appetite, no fatigue, no weight changes, no fever, chills or weakness RESPIRATORY: no cough, SOB, DOE, wheezing, hemoptysis CARDIAC: no chest pain, edema or palpitations GI: no abdominal pain, diarrhea, constipation, nausea or vomiting.  Positive heartburn.  PHYSICAL EXAMINATION  VS:  See vital signs section  GENERAL: no acute distress, normal body habitus EYES: Normal sclerae, normal conjunctivae, no discharge NECK: supple, trachea midline, no neck masses, no thyroid tenderness, no thyromegaly LYMPHATICS: No cervical lymphadenopathy, no supraclavicular lymphadenopathy in the RESPIRATORY: breathing is even & unlabored, BS CTAB CARDIAC: RRR, no murmur,no extra heart sounds, +1 bilateral lower extremity  edema GI: abdomen soft, normal BS, no masses, no tenderness, no hepatomegaly, no splenomegaly PSYCHIATRIC: the patient is alert & oriented to person, affect & behavior appropriate GROIN: Right groin tender to palpation  LABS/RADIOLOGY:  4-15 CBC normal, albumin 3.3, total protein 5.6 otherwise CMP normal, Depakote level 6 10-14 cbc nl, cr 1.02, AP 118 ow cmp nl  4/14 CBC normal, creatinine 1.08, total protein 5.9 otherwise CMP normal, FLP normal, TSH 0.977, Depakote level 50  ASSESSMENT/PLAN:  Hypertension-blood pressure borderline peripheral neuropathy-continue Lyrica. dementia-stable. GERD-stable. right knee OA-cont. Pain meds.  Right groin pain-new problem. Obtain ultrasound.  CPT CODE: 07680  Newton Pigg. Kerry Dory, MD [facility] [phone]

## 2014-01-01 ENCOUNTER — Other Ambulatory Visit: Payer: Self-pay | Admitting: *Deleted

## 2014-01-01 MED ORDER — HYDROCODONE-ACETAMINOPHEN 5-325 MG PO TABS
ORAL_TABLET | ORAL | Status: DC
Start: 2014-01-01 — End: 2014-03-03

## 2014-01-01 NOTE — Telephone Encounter (Signed)
[facility] 

## 2014-01-05 ENCOUNTER — Non-Acute Institutional Stay (SKILLED_NURSING_FACILITY): Payer: Medicare Other | Admitting: Internal Medicine

## 2014-01-05 DIAGNOSIS — M76892 Other specified enthesopathies of left lower limb, excluding foot: Secondary | ICD-10-CM

## 2014-01-05 DIAGNOSIS — M76899 Other specified enthesopathies of unspecified lower limb, excluding foot: Secondary | ICD-10-CM

## 2014-01-07 ENCOUNTER — Other Ambulatory Visit: Payer: Self-pay | Admitting: Internal Medicine

## 2014-01-10 NOTE — Progress Notes (Addendum)
Patient ID: Sharon [name], female   DOB: 03-31-1933, 80 y.o.   MRN: [id number]                  PROGRESS NOTE  DATE:  01/05/2014    [facility]    LEVEL OF CARE:   SNF   Acute Visit   CHIEF COMPLAINT:  Right anterior thigh pain.    HISTORY OF PRESENT ILLNESS:  I was asked to see this patient, who  apparently had an ultrasound of her upper thigh ordered by my colleague in the facility.  They would not do the test due to "no palpable tissue".    The patient states she has constant pain in the right leg.  This is neither relieved nor worsened by standing or walking.  She states this pain is there constantly and then knows of no particular features that make it better or worse.  She denies any history of trauma, swelling, etc.    PHYSICAL EXAMINATION:   MUSCULOSKELETAL:  EXTREMITIES:   RIGHT LOWER EXTREMITY:  Right leg:  She gestures to her right medial thigh as the painful area here.  Range of motion at the right hip seems fairly good, including internal and external rotation (I do not think this is a significant arthropathy).  Peripheral pulses palpable.  There is no tenderness here.  There is no evidence of a DVT.    With deep palpation in the medial thigh, there appears to be tenderness along the medial tendon, ? Which muscle.  It is fairly tender right along what appears to be the muscle here and/or tendon.    As mentioned, there is no evidence that this is a vascular or thromboembolic issue.    ASSESSMENT/PLAN:  Tendinitis.  I think this probably should get a course of pain relief, perhaps local heat.  If this is a continued concern, an ultrasound of the muscle groups and/or a CT scan of the leg would seem to be in order.  It is not hindering her from moving, however.

## 2014-01-20 ENCOUNTER — Non-Acute Institutional Stay (SKILLED_NURSING_FACILITY): Payer: Medicare Other | Admitting: Internal Medicine

## 2014-01-20 DIAGNOSIS — K219 Gastro-esophageal reflux disease without esophagitis: Secondary | ICD-10-CM

## 2014-01-20 DIAGNOSIS — I150 Renovascular hypertension: Secondary | ICD-10-CM

## 2014-01-20 DIAGNOSIS — F0390 Unspecified dementia without behavioral disturbance: Secondary | ICD-10-CM

## 2014-01-20 DIAGNOSIS — G609 Hereditary and idiopathic neuropathy, unspecified: Secondary | ICD-10-CM

## 2014-01-21 DIAGNOSIS — F0390 Unspecified dementia without behavioral disturbance: Secondary | ICD-10-CM | POA: Insufficient documentation

## 2014-01-21 NOTE — Progress Notes (Signed)
         PROGRESS NOTE  DATE: 01-20-14  FACILITY: Nursing Home [facility]: [facility]  LEVEL OF CARE: SNF (31)  Routine Visit  CHIEF COMPLAINT:  Manage GERD, hypertension, dementia and peripheral neuropathy  HISTORY OF PRESENT ILLNESS:  REASSESSMENT OF ONGOING PROBLEM(S):  HTN: Pt 's HTN remains stable. Denies CP, sob, DOE, pedal edema, headaches, dizziness or visual disturbances.  No complications from the medications currently being used.  Last BP : 130/78, 118/78, 128/88, 128/70, 140/68, 130/76.  DEMENTIA: The dementia remaines stable and continues to function adequately in the current living environment with supervision.  The patient has had little changes in behavior. No complications noted from the medications presently being used.  PERIPHERAL NEUROPATHY: The peripheral neuropathy is stable. The patient denies pain in the feet, tingling, and numbness. No complications noted from the medication presently being used.  GERD: pt's GERD is stable.  Complains of heartburn, but denies abd. Pain, nausea or vomiting.  Currently not on a PPI.  PAST MEDICAL HISTORY : Reviewed.  No changes.  [facility]  REVIEW OF SYSTEMS:  GENERAL: no change in appetite, no fatigue, no weight changes, no fever, chills or weakness RESPIRATORY: no cough, SOB, DOE, wheezing, hemoptysis CARDIAC: no chest pain, edema or palpitations GI: no abdominal pain, diarrhea, constipation, nausea or vomiting.  Positive heartburn.  PHYSICAL EXAMINATION  VS:  See vital signs section  GENERAL: no acute distress, normal body habitus NECK: supple, trachea midline, no neck masses, no thyroid tenderness, no thyromegaly RESPIRATORY: breathing is even & unlabored, BS CTAB CARDIAC: RRR, no murmur,no extra heart sounds, +1 bilateral lower extremity edema GI: abdomen soft, normal BS, no masses, no tenderness, no hepatomegaly, no splenomegaly PSYCHIATRIC: the patient is alert &  oriented to person, affect & behavior appropriate  LABS/RADIOLOGY:  4-15 CBC normal, albumin 3.3, total protein 5.6 otherwise CMP normal, Depakote level 6 10-14 cbc nl, cr 1.02, AP 118 ow cmp nl  4/14 CBC normal, creatinine 1.08, total protein 5.9 otherwise CMP normal, FLP normal, TSH 0.977, Depakote level 50  ASSESSMENT/PLAN:  Hypertension-well controlled peripheral neuropathy-continue Lyrica. dementia-stable. GERD-stable. right knee OA-cont. Pain meds.  Right groin pain-ongoing problem. Obtain ultrasound results  CPT CODE: 4098199308  Sharon PiggGayani Y. Kerry Doryasanayaka, MD [facility] [phone]

## 2014-01-25 ENCOUNTER — Non-Acute Institutional Stay (SKILLED_NURSING_FACILITY): Payer: Medicare Other | Admitting: Internal Medicine

## 2014-01-25 DIAGNOSIS — M25559 Pain in unspecified hip: Secondary | ICD-10-CM

## 2014-01-25 DIAGNOSIS — M25551 Pain in right hip: Secondary | ICD-10-CM

## 2014-01-26 ENCOUNTER — Other Ambulatory Visit (HOSPITAL_BASED_OUTPATIENT_CLINIC_OR_DEPARTMENT_OTHER): Payer: Self-pay | Admitting: Internal Medicine

## 2014-01-27 ENCOUNTER — Other Ambulatory Visit: Payer: Self-pay | Admitting: Internal Medicine

## 2014-01-27 DIAGNOSIS — R1031 Right lower quadrant pain: Secondary | ICD-10-CM

## 2014-01-30 DIAGNOSIS — M25559 Pain in unspecified hip: Secondary | ICD-10-CM | POA: Insufficient documentation

## 2014-01-30 NOTE — Progress Notes (Signed)
Patient ID: Sharon SinningMaebell [name], female   DOB: May 24, 1933, 80 y.o.   MRN: [id number]            PROGRESS NOTE  DATE: 01/25/2014       [facility]  LEVEL OF CARE: SNF (31)  Acute Visit  CHIEF COMPLAINT:  Manage right thigh and groin pain.    HISTORY OF PRESENT ILLNESS: I was requested by the staff to assess the patient regarding above problem(s):  This patient is complaining of ongoing right groin and thigh pain.  An ultrasound was attempted in the area but was not successful.  She denies radiation of the pain.  The pain has been present for some time constantly.  There are no other associated signs and symptoms.  There is no temporal relationship.    PAST MEDICAL HISTORY : Reviewed.  No changes/see problem list  CURRENT MEDICATIONS: Reviewed per [facility]/see medication list  REVIEW OF SYSTEMS:  GENERAL: no change in appetite, no fatigue, no weight changes, no fever, chills or weakness RESPIRATORY: no cough, SOB, DOE,, wheezing, hemoptysis CARDIAC: no chest pain, edema or palpitations GI: no abdominal pain, diarrhea, constipation, heart burn, nausea or vomiting  PHYSICAL EXAMINATION  VS: see VS section  GENERAL: no acute distress, normal body habitus NECK: supple, trachea midline, no neck masses, no thyroid tenderness, no thyromegaly RESPIRATORY: breathing is even & unlabored, BS CTAB CARDIAC: RRR, no murmur,no extra heart sounds, no edema GI: abdomen soft, normal BS, no masses, no tenderness, no hepatomegaly, no splenomegaly PSYCHIATRIC: the patient is alert & oriented to person, affect & behavior appropriate GROIN:  There is tenderness in the right groin, but markedly tender in the right medial thigh.    ASSESSMENT/PLAN:  Right thigh and groin pain.  Ongoing, uncontrolled problem.  Obtain pelvic and right medial thigh CAT scan with contrast.  Use p.r.n. pain medications.    CPT CODE: [id number]          Angela CoxGayani Y Dasanayaka, MD [facility] [phone]

## 2014-02-19 ENCOUNTER — Ambulatory Visit (HOSPITAL_COMMUNITY)
Admission: RE | Admit: 2014-02-19 | Discharge: 2014-02-19 | Disposition: A | Discharge status: Home / Self Care | Payer: Medicare Other | Source: Ambulatory Visit | Attending: Internal Medicine | Admitting: Internal Medicine

## 2014-02-19 DIAGNOSIS — M1610 Unilateral primary osteoarthritis, unspecified hip: Secondary | ICD-10-CM | POA: Insufficient documentation

## 2014-02-19 DIAGNOSIS — R1031 Right lower quadrant pain: Secondary | ICD-10-CM

## 2014-02-19 DIAGNOSIS — R109 Unspecified abdominal pain: Secondary | ICD-10-CM | POA: Diagnosis present

## 2014-02-19 LAB — CREATININE, SERUM
CREATININE: 0.87 mg/dL (ref 0.50–1.10)
GFR calc Af Amer: 72 mL/min — ABNORMAL LOW (ref >90)
GFR calc non Af Amer: 62 mL/min — ABNORMAL LOW (ref >90)

## 2014-02-19 MED ORDER — GADOBENATE DIMEGLUMINE 529 MG/ML IV SOLN
15.0000 mL | Freq: Once | INTRAVENOUS | Status: AC | PRN
  Administered 2014-02-19: 15 mL via INTRAVENOUS

## 2014-03-01 ENCOUNTER — Non-Acute Institutional Stay (SKILLED_NURSING_FACILITY): Payer: Medicare Other | Admitting: Internal Medicine

## 2014-03-01 DIAGNOSIS — G609 Hereditary and idiopathic neuropathy, unspecified: Secondary | ICD-10-CM

## 2014-03-01 DIAGNOSIS — G309 Alzheimer's disease, unspecified: Secondary | ICD-10-CM

## 2014-03-01 DIAGNOSIS — F0280 Dementia in other diseases classified elsewhere without behavioral disturbance: Secondary | ICD-10-CM

## 2014-03-01 DIAGNOSIS — M25551 Pain in right hip: Secondary | ICD-10-CM

## 2014-03-01 DIAGNOSIS — I10 Essential (primary) hypertension: Secondary | ICD-10-CM

## 2014-03-01 DIAGNOSIS — M25559 Pain in unspecified hip: Secondary | ICD-10-CM

## 2014-03-01 NOTE — Progress Notes (Signed)
PROGRESS NOTE  DATE: 03-01-14  FACILITY: Nursing Home [facility]  LEVEL OF CARE: SNF (31)  Routine Visit  CHIEF COMPLAINT:  Manage GERD, hypertension, dementia and peripheral neuropathy  HISTORY OF PRESENT ILLNESS:  REASSESSMENT OF ONGOING PROBLEM(S):  HTN: Pt 's HTN remains stable. Denies CP, sob, DOE, pedal edema, headaches, dizziness or visual disturbances.  No complications from the medications currently being used.  Last BP : 130/78, 118/78, 128/88, 128/70, 140/68, 130/76, 120/70.  DEMENTIA: The dementia remaines stable and continues to function adequately in the current living environment with supervision.  The patient has had little changes in behavior. No complications noted from the medications presently being used.  PERIPHERAL NEUROPATHY: The peripheral neuropathy is stable. The patient denies pain in the feet, tingling, and numbness. No complications noted from the medication presently being used.  GERD: pt's GERD is stable.  Complains of heartburn, but denies abd. Pain, nausea or vomiting.  Currently not on a PPI.  PAST MEDICAL HISTORY : Reviewed.  No changes.  [facility]: [facility]  REVIEW OF SYSTEMS:  GENERAL: no change in appetite, no fatigue, no weight changes, no fever, chills or weakness RESPIRATORY: no cough, SOB, DOE, wheezing, hemoptysis CARDIAC: no chest pain, edema or palpitations GI: no abdominal pain, diarrhea, constipation, nausea or vomiting.  MUSCULOSKELETAL: Complains of ongoing right knee pain and right groin/thigh pain  PHYSICAL EXAMINATION  VS:  See vital signs section  GENERAL: no acute distress, normal body habitus EYES: Normal sclerae, normal conjunctivae, no discharge NECK: supple, trachea midline, no neck masses, no thyroid tenderness, no thyromegaly LYMPHATICS: No cervical lymphadenopathy, no supraclavicular lymphadenopathy RESPIRATORY: breathing is even & unlabored, BS  CTAB CARDIAC: RRR, no murmur,no extra heart sounds, +1 bilateral lower extremity edema GI: abdomen soft, normal BS, no masses, no tenderness, no hepatomegaly, no splenomegaly PSYCHIATRIC: the patient is alert & oriented to person, affect & behavior appropriate MUSCULOSKELETAL: Right knee has a brace on  LABS/RADIOLOGY:  4-15 CBC normal, albumin 3.3, total protein 5.6 otherwise CMP normal, Depakote level 6 10-14 cbc nl, cr 1.02, AP 118 ow cmp nl  4/14 CBC normal, creatinine 1.08, total protein 5.9 otherwise CMP normal, FLP normal, TSH 0.977, Depakote level 50  MRI PELVIS WITHOUT AND WITH CONTRAST   TECHNIQUE: Multiplanar multisequence MR imaging of the pelvis was performed both before and after administration of intravenous contrast.   CONTRAST:  15mL MULTIHANCE GADOBENATE DIMEGLUMINE 529 MG/ML IV SOLN   COMPARISON:  Lumbar MRI 10/02/2009.  Abdominal pelvic CT 05/28/2008.   FINDINGS: There are asymmetric arthropathic changes at the right hip with joint space loss, osteophytes and subchondral cyst formation within the acetabulum. The right hip joint is distended with T2 hyperintense material which shows fairly homogeneous enhancement following contrast, most consistent with synovitis. The enhancement extends into subchondral cyst formation anteriorly in the acetabular roof. No erosive changes are identified. There is no bone destruction or evidence of avascular necrosis.   The left hip joint appears normal. The sacroiliac joints and symphysis pubis appear normal. There is stable annular disc bulging at L4-5 and mild facet disease within the lower lumbar spine.   A small amount of fat in both inguinal canals appears stable. No other hernias are identified. Small right external iliac and pelvic sidewall lymph nodes are likely reactive. The groins appear normal without adenopathy.   The hamstring, iliopsoas and gluteus tendons appear normal bilaterally. There is no focal pelvic  muscular  atrophy or abnormal enhancement. The visualized internal pelvic contents otherwise appear unremarkable.   IMPRESSION: 1. Asymmetric right hip arthropathy with prominent synovial thickening and enhancement. 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 2. No acute osseous findings or evidence of avascular necrosis. 3. Probable small reactive lymph nodes in the right pelvis. No significant inguinal findings.    ASSESSMENT/PLAN:  Hypertension-well controlled peripheral neuropathy-continue Lyrica. dementia-stable. GERD-stable. right knee OA-cont. Pain meds and brace. We'll obtain orthopedic consultation.  Right groin pain-ongoing problem. Pelvic MRI negative. Obtain orthopedic consultation  CPT CODE: 16109  Newton Pigg. Kerry Dory, MD [facility] [phone]

## 2014-03-03 ENCOUNTER — Other Ambulatory Visit: Payer: Self-pay | Admitting: *Deleted

## 2014-03-03 MED ORDER — HYDROCODONE-ACETAMINOPHEN 5-325 MG PO TABS: ORAL_TABLET | ORAL | Status: DC

## 2014-03-03 NOTE — Telephone Encounter (Signed)
[facility] 

## 2014-03-08 ENCOUNTER — Non-Acute Institutional Stay (SKILLED_NURSING_FACILITY): Payer: Medicare Other | Admitting: Internal Medicine

## 2014-03-08 DIAGNOSIS — G309 Alzheimer's disease, unspecified: Principal | ICD-10-CM

## 2014-03-08 DIAGNOSIS — F0280 Dementia in other diseases classified elsewhere without behavioral disturbance: Secondary | ICD-10-CM

## 2014-03-10 NOTE — Progress Notes (Signed)
Patient ID: Sharon [name], female   DOB: [birth year], 80 y.o.   MRN: [id number]           PROGRESS NOTE  DATE: 03/08/2014          [facility]  LEVEL OF CARE: SNF (31)  Acute Visit  CHIEF COMPLAINT:  Manage dementia.    HISTORY OF PRESENT ILLNESS: I was requested by the staff to assess the patient regarding above problem(s):  DEMENTIA:  I was requested by the patient to see her to discuss her memory status.  She has a diagnosis of dementia and, on MMSE of 2009, she scored 19/30.  She is currently on Aricept, Namenda, and Depakote.  The dementia remains stable and continues to function adequately in the current living environment with supervision.  The patient has had little changes in behavior. No complications noted from the medications presently being used.    PAST MEDICAL HISTORY : Reviewed.  No changes/see problem list  CURRENT MEDICATIONS: Reviewed per [facility]/see medication list  REVIEW OF SYSTEMS:  GENERAL: no change in appetite, no fatigue, no weight changes, no fever, chills or weakness RESPIRATORY: no cough, SOB, DOE,, wheezing, hemoptysis CARDIAC: no chest pain, edema or palpitations GI: no abdominal pain, diarrhea, constipation, heart burn, nausea or vomiting  PHYSICAL EXAMINATION  VS: see VS section  GENERAL: no acute distress, normal body habitus NECK: supple, trachea midline, no neck masses, no thyroid tenderness, no thyromegaly RESPIRATORY: breathing is even & unlabored, BS CTAB CARDIAC: RRR, no murmur,no extra heart sounds, no edema GI: abdomen soft, normal BS, no masses, no tenderness, no hepatomegaly, no splenomegaly PSYCHIATRIC: the patient is alert & oriented to person, affect & behavior appropriate  ASSESSMENT/PLAN:  Dementia.  Patient has an established diagnosis.  Check TSH and vitamin B12 level.  Increase Namenda to 10 mg b.i.d.  Discussed with patient.    CPT CODE: [id number]            Angela CoxGayani Y Faron Whitelock, MD [facility] [phone]

## 2014-04-06 ENCOUNTER — Other Ambulatory Visit: Payer: Self-pay | Admitting: *Deleted

## 2014-04-06 MED ORDER — HYDROCODONE-ACETAMINOPHEN 5-325 MG PO TABS: ORAL_TABLET | ORAL | Status: DC

## 2014-04-06 NOTE — Telephone Encounter (Signed)
[facility] 

## 2014-04-07 ENCOUNTER — Non-Acute Institutional Stay (SKILLED_NURSING_FACILITY): Payer: Medicare Other | Admitting: Internal Medicine

## 2014-04-07 DIAGNOSIS — F0280 Dementia in other diseases classified elsewhere without behavioral disturbance: Secondary | ICD-10-CM

## 2014-04-07 DIAGNOSIS — K219 Gastro-esophageal reflux disease without esophagitis: Secondary | ICD-10-CM

## 2014-04-07 DIAGNOSIS — G309 Alzheimer's disease, unspecified: Secondary | ICD-10-CM

## 2014-04-07 DIAGNOSIS — G609 Hereditary and idiopathic neuropathy, unspecified: Secondary | ICD-10-CM

## 2014-04-07 DIAGNOSIS — I10 Essential (primary) hypertension: Secondary | ICD-10-CM

## 2014-04-09 NOTE — Progress Notes (Signed)
PROGRESS NOTE  DATE: 04-07-14  FACILITY: Nursing Home Location: [facility]  LEVEL OF CARE: SNF (31)  Routine Visit  CHIEF COMPLAINT:  Manage GERD, hypertension, dementia and peripheral neuropathy  HISTORY OF PRESENT ILLNESS:  REASSESSMENT OF ONGOING PROBLEM(S):  HTN: Pt 's HTN remains stable. Denies CP, sob, DOE, pedal edema, headaches, dizziness or visual disturbances.  No complications from the medications currently being used.  Last BP : 130/78, 118/78, 128/88, 128/70, 140/68, 130/76, 120/70, 144/84.  DEMENTIA: The dementia remaines stable and continues to function adequately in the current living environment with supervision.  The patient has had little changes in behavior. No complications noted from the medications presently being used.  PERIPHERAL NEUROPATHY: The peripheral neuropathy is stable. The patient denies pain in the feet, tingling, and numbness. No complications noted from the medication presently being used.  GERD: pt's GERD is stable.  Complains of heartburn, but denies abd. Pain, nausea or vomiting.  Currently not on a PPI.  PAST MEDICAL HISTORY : Reviewed.  No changes.  CURRENT MEDICATIONS: Reviewed per [facility]  REVIEW OF SYSTEMS:  GENERAL: no change in appetite, no fatigue, no weight changes, no fever, chills or weakness RESPIRATORY: no cough, SOB, DOE, wheezing, hemoptysis CARDIAC: no chest pain, edema or palpitations GI: no abdominal pain, diarrhea, constipation, nausea or vomiting.   PHYSICAL EXAMINATION  VS:  See vital signs section  GENERAL: no acute distress, normal body habitus NECK: supple, trachea midline, no neck masses, no thyroid tenderness, no thyromegaly RESPIRATORY: breathing is even & unlabored, BS CTAB CARDIAC: RRR, no murmur,no extra heart sounds, +1 bilateral lower extremity edema GI: abdomen soft, normal BS, no masses, no tenderness, no hepatomegaly, no splenomegaly PSYCHIATRIC: the patient is alert & oriented  to person, affect & behavior appropriate  LABS/RADIOLOGY: 8-15 vitamin B12 level 277, folate 14.7, TSH 1.47 4-15 CBC normal, albumin 3.3, total protein 5.6 otherwise CMP normal, Depakote level 6 10-14 cbc nl, cr 1.02, AP 118 ow cmp nl  4/14 CBC normal, creatinine 1.08, total protein 5.9 otherwise CMP normal, FLP normal, TSH 0.977, Depakote level 50  MRI PELVIS WITHOUT AND WITH CONTRAST   TECHNIQUE: Multiplanar multisequence MR imaging of the pelvis was performed both before and after administration of intravenous contrast.   CONTRAST:  15mL MULTIHANCE GADOBENATE DIMEGLUMINE 529 MG/ML IV SOLN   COMPARISON:  Lumbar MRI 10/02/2009.  Abdominal pelvic CT 05/28/2008.   FINDINGS: There are asymmetric arthropathic changes at the right hip with joint space loss, osteophytes and subchondral cyst formation within the acetabulum. The right hip joint is distended with T2 hyperintense material which shows fairly homogeneous enhancement following contrast, most consistent with synovitis. The enhancement extends into subchondral cyst formation anteriorly in the acetabular roof. No erosive changes are identified. There is no bone destruction or evidence of avascular necrosis.   The left hip joint appears normal. The sacroiliac joints and symphysis pubis appear normal. There is stable annular disc bulging at L4-5 and mild facet disease within the lower lumbar spine.   A small amount of fat in both inguinal canals appears stable. No other hernias are identified. Small right external iliac and pelvic sidewall lymph nodes are likely reactive. The groins appear normal without adenopathy.   The hamstring, iliopsoas and gluteus tendons appear normal bilaterally. There is no focal pelvic muscular atrophy or abnormal enhancement. The visualized internal pelvic contents otherwise appear unremarkable.   IMPRESSION: 1. Asymmetric right hip arthropathy with prominent synovial thickening  and  enhancement. 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 2. No acute osseous findings or evidence of avascular necrosis. 3. Probable small reactive lymph nodes in the right pelvis. No significant inguinal findings.    ASSESSMENT/PLAN:  Hypertension-blood pressure borderline. Will review a log peripheral neuropathy-continue Lyrica. dementia-stable. GERD-stable. right knee OA-cont. Pain meds.  CPT CODE: 16109  Newton Pigg. Kerry Dory, MD [facility] [phone]

## 2014-04-13 ENCOUNTER — Other Ambulatory Visit: Payer: Self-pay | Admitting: *Deleted

## 2014-04-13 MED ORDER — PREGABALIN 50 MG PO CAPS: ORAL_CAPSULE | ORAL | Status: DC

## 2014-04-13 NOTE — Telephone Encounter (Signed)
Sharon [name] medical [facility]

## 2014-04-18 ENCOUNTER — Emergency Department (HOSPITAL_COMMUNITY)
Admission: EM | Admit: 2014-04-18 | Discharge: 2014-04-18 | Disposition: A | Discharge status: Home / Self Care | Payer: Medicare Other | Attending: Emergency Medicine | Admitting: Emergency Medicine

## 2014-04-18 ENCOUNTER — Encounter (HOSPITAL_COMMUNITY): Payer: Self-pay | Admitting: Emergency Medicine

## 2014-04-18 ENCOUNTER — Emergency Department (HOSPITAL_COMMUNITY): Payer: Medicare Other

## 2014-04-18 DIAGNOSIS — R079 Chest pain, unspecified: Secondary | ICD-10-CM | POA: Insufficient documentation

## 2014-04-18 DIAGNOSIS — Z79899 Other long term (current) drug therapy: Secondary | ICD-10-CM | POA: Insufficient documentation

## 2014-04-18 DIAGNOSIS — Z791 Long term (current) use of non-steroidal anti-inflammatories (NSAID): Secondary | ICD-10-CM | POA: Insufficient documentation

## 2014-04-18 DIAGNOSIS — R0789 Other chest pain: Secondary | ICD-10-CM | POA: Diagnosis not present

## 2014-04-18 DIAGNOSIS — Z8719 Personal history of other diseases of the digestive system: Secondary | ICD-10-CM | POA: Insufficient documentation

## 2014-04-18 DIAGNOSIS — Z87891 Personal history of nicotine dependence: Secondary | ICD-10-CM | POA: Diagnosis not present

## 2014-04-18 LAB — I-STAT CHEM 8, ED
BUN: 8 mg/dL (ref 6–23)
CHLORIDE: 107 meq/L (ref 96–112)
Calcium, Ion: 1.13 mmol/L (ref 1.13–1.30)
Creatinine, Ser: 1.1 mg/dL (ref 0.50–1.10)
Glucose, Bld: 79 mg/dL (ref 70–99)
HEMATOCRIT: 44 % (ref 36.0–46.0)
Hemoglobin: 15 g/dL (ref 12.0–15.0)
POTASSIUM: 3.6 meq/L — AB (ref 3.7–5.3)
Sodium: 145 mEq/L (ref 137–147)
TCO2: 28 mmol/L (ref 0–100)

## 2014-04-18 LAB — TROPONIN I

## 2014-04-18 LAB — CBC
HCT: 40.4 % (ref 36.0–46.0)
Hemoglobin: 13.7 g/dL (ref 12.0–15.0)
MCH: 29.5 pg (ref 26.0–34.0)
MCHC: 33.9 g/dL (ref 30.0–36.0)
MCV: 87.1 fL (ref 78.0–100.0)
PLATELETS: 177 10*3/uL (ref 150–400)
RBC: 4.64 MIL/uL (ref 3.87–5.11)
RDW: 13.6 % (ref 11.5–15.5)
WBC: 9.8 10*3/uL (ref 4.0–10.5)

## 2014-04-18 MED ORDER — MORPHINE SULFATE 4 MG/ML IJ SOLN
4.0000 mg | Freq: Once | INTRAMUSCULAR | Status: AC
  Administered 2014-04-18: 4 mg via INTRAVENOUS
  Filled 2014-04-18: qty 1

## 2014-04-18 MED ORDER — ONDANSETRON HCL 4 MG/2ML IJ SOLN
4.0000 mg | Freq: Once | INTRAMUSCULAR | Status: AC
  Administered 2014-04-18: 4 mg via INTRAVENOUS
  Filled 2014-04-18: qty 2

## 2014-04-18 NOTE — ED Notes (Addendum)
Per EMS- pt has had chest pain since this morning while she way lying in bed. Pain is sharp in center of chest. Denies SOB. No relief or worsening with breathing/movement. Pt does have tenderness in rt chest and  upper abdomen. Pt received  of Asprin and 1 nitro at facility

## 2014-04-18 NOTE — ED Notes (Signed)
Call pts son, Chrissie Noa, at [phone] with updates per pts request

## 2014-04-18 NOTE — ED Notes (Signed)
Notified [facility] for transportation back home 

## 2014-04-18 NOTE — ED Notes (Signed)
Family called and states that they are coming to transport pt back to [facility]. [facility] notified and transport canceled

## 2014-04-18 NOTE — ED Notes (Signed)
NP at bedside.

## 2014-04-18 NOTE — Discharge Instructions (Signed)

## 2014-04-18 NOTE — ED Notes (Signed)
Pts son called and updated per pts request

## 2014-04-18 NOTE — ED Provider Notes (Signed)
CSN: [id number]     Arrival date & time 04/18/14  1419 History   First MD Initiated Contact with Patient 04/18/14 1424     Chief Complaint  Patient presents with  . Chest Pain     (Consider location/radiation/quality/duration/timing/severity/associated sxs/prior Treatment) HPI Comments: Pt comes in today with c/o  Chest pain that started this morning. Pt states the nitro helped a little. Pt states that she has not had a cough, fever, sob, n/v or diaphoresis. Pt states that she is still having the pain at this time. She was laying in bed when the pain started  The history is provided by the patient. No language interpreter was used.    Past Medical History  Diagnosis Date  . Emphysema   . Lung nodule   . Diverticulosis of colon   . Dysphagia   . Peptic stricture of esophagus   . Ejection fraction   . Chest pain    History reviewed. No pertinent past surgical history. No family history on file. History  Substance Use Topics  . Smoking status: Former Games developer  . Smokeless tobacco: Not on file  . Alcohol Use: No   OB History   Grav Para Term Preterm Abortions TAB SAB Ect Mult Living                 Review of Systems  Constitutional: Negative.   Respiratory: Negative.   Cardiovascular: Positive for chest pain.      Allergies  Ultram  Home Medications   Prior to Admission medications   Medication Sig Start Date End Date Taking? Authorizing Provider  amLODipine (NORVASC) 2.5 MG tablet Take 2.5 mg by mouth daily.   Yes Historical Provider, MD  calcium-vitamin D (OSCAL WITH D) 500-200 MG-UNIT per tablet Take 1 tablet by mouth daily.   Yes Historical Provider, MD  diclofenac sodium (VOLTAREN) 1 % GEL Apply 2 g topically 4 (four) times daily. To right knee 04/01/13  Yes Sharee Holster, NP  divalproex (DEPAKOTE) 250 MG DR tablet Take 250 mg by mouth at bedtime.   Yes Historical Provider, MD  donepezil (ARICEPT) 10 MG tablet Take 10 mg by mouth at bedtime.   Yes Historical  Provider, MD  HYDROcodone-acetaminophen (NORCO/VICODIN) 5-325 MG per tablet Take 1 tablet by mouth every 6 (six) hours as needed for moderate pain.   Yes Historical Provider, MD  Memantine HCl (NAMENDA XR PO) Take 5 mg by mouth 2 (two) times daily.   Yes Historical Provider, MD  nitroGLYCERIN (NITROSTAT) 0.4 MG SL tablet Place 0.4 mg under the tongue every 5 (five) minutes as needed for chest pain.   Yes Historical Provider, MD  omeprazole (PRILOSEC) 40 MG capsule Take 40 mg by mouth daily.   Yes Historical Provider, MD  pregabalin (LYRICA) 50 MG capsule Take 50 mg by mouth 2 (two) times daily.   Yes Historical Provider, MD   There were no vitals taken for this visit. Physical Exam  Nursing note and vitals reviewed. Constitutional: She appears well-developed and well-nourished.  Cardiovascular: Normal rate and regular rhythm.   Pulmonary/Chest: Effort normal and breath sounds normal.  Right chest tender to palpation  Abdominal: Soft. Bowel sounds are normal.  Musculoskeletal: Normal range of motion.  Neurological: She is alert. Coordination normal.  Skin: Skin is warm and dry.    ED Course  Procedures (including critical care time) Labs Review Labs Reviewed  I-STAT CHEM 8, ED - Abnormal; Notable for the following:    Potassium 3.6 (*)  All other components within normal limits  CBC  TROPONIN I    Imaging Review Dg Chest 2 View  04/18/2014   CLINICAL DATA:  Short of breath.  Hypertension  EXAM: CHEST  2 VIEW  COMPARISON:  03/28/2013  FINDINGS: Normal heart size. Calcified atherosclerotic disease involves the thoracic and abdominal aorta. No pleural effusion. No airspace consolidation or atelectasis. Coarsened interstitial markings are noted bilaterally compatible with mild emphysema. 6 mm right upper lobe nodule is unchanged. Advanced osteoarthritis is noted involving the right glenohumeral joint.  IMPRESSION: 1. No acute cardiopulmonary abnormalities. 2. Atherosclerotic disease.    Electronically Signed   By: Signa Kell M.D.   On: 04/18/2014 16:37     EKG Interpretation   Date/Time:  Sunday April 18 2014 14:30:15 EDT Ventricular Rate:  72 PR Interval:  151 QRS Duration: 79 QT Interval:  441 QTC Calculation: 483 R Axis:   -11 Text Interpretation:  Sinus rhythm Low voltage, precordial leads Baseline  wander When compared with ECG of 03/31/2013 T wave abnormality Anterior  leads is no longer Present Confirmed by [facility]  MD, Nicholos Johns [phone]) on  04/18/2014 2:49:46 PM      MDM   Final diagnoses:  Other chest pain    Pt is pain free at this time. Trop negative. Pt had a normal cath in 8/14. Think reasonable to send home. Pt is in agreement with this plan    Teressa Lower, NP 04/18/14 1642

## 2014-04-21 NOTE — ED Provider Notes (Signed)
Medical screening examination/treatment/procedure(s) were performed by non-physician practitioner and as supervising physician I was immediately available for consultation/collaboration.   EKG Interpretation   Date/Time:  Sunday April 18 2014 14:30:15 EDT Ventricular Rate:  72 PR Interval:  151 QRS Duration: 79 QT Interval:  441 QTC Calculation: 483 R Axis:   -11 Text Interpretation:  Sinus rhythm Low voltage, precordial leads Baseline  wander When compared with ECG of 03/31/2013 T wave abnormality Anterior  leads is no longer Present Confirmed by [facility]  MD, Nicholos Johns [phone]) on  04/18/2014 2:49:46 PM        Samuel Jester, DO 04/21/14 6045

## 2014-05-05 ENCOUNTER — Other Ambulatory Visit: Payer: Self-pay | Admitting: *Deleted

## 2014-05-05 MED ORDER — HYDROCODONE-ACETAMINOPHEN 5-325 MG PO TABS: ORAL_TABLET | ORAL | Status: AC

## 2014-05-05 NOTE — Telephone Encounter (Signed)
[facility] 

## 2014-05-24 ENCOUNTER — Non-Acute Institutional Stay (SKILLED_NURSING_FACILITY): Payer: Medicare Other | Admitting: Internal Medicine

## 2014-05-24 DIAGNOSIS — N179 Acute kidney failure, unspecified: Secondary | ICD-10-CM

## 2014-05-26 ENCOUNTER — Non-Acute Institutional Stay (SKILLED_NURSING_FACILITY): Payer: Medicare Other | Admitting: Internal Medicine

## 2014-05-26 DIAGNOSIS — K219 Gastro-esophageal reflux disease without esophagitis: Secondary | ICD-10-CM

## 2014-05-26 DIAGNOSIS — F0280 Dementia in other diseases classified elsewhere without behavioral disturbance: Secondary | ICD-10-CM

## 2014-05-26 DIAGNOSIS — G309 Alzheimer's disease, unspecified: Secondary | ICD-10-CM

## 2014-05-26 DIAGNOSIS — G609 Hereditary and idiopathic neuropathy, unspecified: Secondary | ICD-10-CM

## 2014-05-26 DIAGNOSIS — I10 Essential (primary) hypertension: Secondary | ICD-10-CM

## 2014-05-28 NOTE — Progress Notes (Signed)
Patient ID: Sharon [name], female   DOB: 1933/01/03, 80 y.o.   MRN: [id number]           PROGRESS NOTE  DATE: 05/24/2014           [facility]  LEVEL OF CARE: SNF (31)  Acute Visit  CHIEF COMPLAINT:  Manage acute renal failure.           HISTORY OF PRESENT ILLNESS: I was requested by the staff to assess the patient regarding above problem(s):  On 05/19/2014:  BUN 9, creatinine 1.13.  In 11/2013:  Creatinine 0.92.  Patient is currently not on any renal toxic medications.  She denies increasing confusion or lower extremity swelling.          PAST MEDICAL HISTORY : Reviewed.  No changes/see problem list  CURRENT MEDICATIONS: Reviewed per MAR/see medication list  REVIEW OF SYSTEMS:  GENERAL: no change in appetite, no fatigue, no weight changes, no fever, chills or weakness RESPIRATORY: no cough, SOB, DOE,, wheezing, hemoptysis CARDIAC: no chest pain, edema or palpitations GI: no abdominal pain, diarrhea, constipation, heart burn, nausea or vomiting  PHYSICAL EXAMINATION  VS: see VS section  GENERAL: no acute distress, normal body habitus NECK: supple, trachea midline, no neck masses, no thyroid tenderness, no thyromegaly RESPIRATORY: breathing is even & unlabored, BS CTAB CARDIAC: RRR, no murmur,no extra heart sounds, 1+ bilateral lower extremity edema     GI: abdomen soft, normal BS, no masses, no tenderness, no hepatomegaly, no splenomegaly PSYCHIATRIC: the patient is alert & oriented to person, affect & behavior appropriate  ASSESSMENT/PLAN:  Acute renal failure.  New problem.  Recheck on 05/28/2014.           CPT CODE: [id number]           Angela CoxGayani Y Dasanayaka, MD [facility] [phone]

## 2014-05-29 NOTE — Progress Notes (Signed)
PROGRESS NOTE  DATE: 05-26-14  FACILITY: Nursing Home Location[facility]  LEVEL OF CARE: SNF (31)  Routine Visit  CHIEF COMPLAINT:  Manage GERD, hypertension, dementia and peripheral neuropathy  HISTORY OF PRESENT ILLNESS:  REASSESSMENT OF ONGOING PROBLEM(S):  HTN: Pt 's HTN remains stable. Denies CP, sob, DOE, pedal edema, headaches, dizziness or visual disturbances.  No complications from the medications currently being used.  Last BP : 130/78, 118/78, 128/88, 128/70, 140/68, 130/76, 120/70, 144/84, 138/86.  DEMENTIA: The dementia remaines stable and continues to function adequately in the current living environment with supervision.  The patient has had little changes in behavior. No complications noted from the medications presently being used.  PERIPHERAL NEUROPATHY: The peripheral neuropathy is stable. The patient denies pain in the feet, tingling, and numbness. No complications noted from the medication presently being used.  GERD: pt's GERD is stable.  Complains of heartburn, but denies abd. Pain, nausea or vomiting.  Currently not on a PPI.  PAST MEDICAL HISTORY : Reviewed.  No changes.  [facility]  REVIEW OF SYSTEMS:  GENERAL: no change in appetite, no fatigue, no weight changes, no fever, chills or weakness RESPIRATORY: no cough, SOB, DOE, wheezing, hemoptysis CARDIAC: no chest pain, edema or palpitations GI: no abdominal pain, diarrhea, constipation, nausea or vomiting.   PHYSICAL EXAMINATION  VS:  See vital signs section  GENERAL: no acute distress, normal body habitus NECK: supple, trachea midline, no neck masses, no thyroid tenderness, no thyromegaly RESPIRATORY: breathing is even & unlabored, BS CTAB CARDIAC: RRR, no murmur,no extra heart sounds, +1 bilateral lower extremity edema GI: abdomen soft, normal BS, no masses, no tenderness, no hepatomegaly, no splenomegaly PSYCHIATRIC: the patient is alert  & oriented to person, affect & behavior appropriate  LABS/RADIOLOGY: 8-15 vitamin B12 level 277, folate 14.7, TSH 1.47 4-15 CBC normal, albumin 3.3, total protein 5.6 otherwise CMP normal, Depakote level 6 10-14 cbc nl, cr 1.02, AP 118 ow cmp nl  4/14 CBC normal, creatinine 1.08, total protein 5.9 otherwise CMP normal, FLP normal, TSH 0.977, Depakote level 50  MRI PELVIS WITHOUT AND WITH CONTRAST   TECHNIQUE: Multiplanar multisequence MR imaging of the pelvis was performed both before and after administration of intravenous contrast.   CONTRAST:  15mL MULTIHANCE GADOBENATE DIMEGLUMINE 529 MG/ML IV SOLN   COMPARISON:  Lumbar MRI 10/02/2009.  Abdominal pelvic CT 05/28/2008.   FINDINGS: There are asymmetric arthropathic changes at the right hip with joint space loss, osteophytes and subchondral cyst formation within the acetabulum. The right hip joint is distended with T2 hyperintense material which shows fairly homogeneous enhancement following contrast, most consistent with synovitis. The enhancement extends into subchondral cyst formation anteriorly in the acetabular roof. No erosive changes are identified. There is no bone destruction or evidence of avascular necrosis.   The left hip joint appears normal. The sacroiliac joints and symphysis pubis appear normal. There is stable annular disc bulging at L4-5 and mild facet disease within the lower lumbar spine.   A small amount of fat in both inguinal canals appears stable. No other hernias are identified. Small right external iliac and pelvic sidewall lymph nodes are likely reactive. The groins appear normal without adenopathy.   The hamstring, iliopsoas and gluteus tendons appear normal bilaterally. There is no focal pelvic muscular atrophy or abnormal enhancement. The visualized internal pelvic contents otherwise appear unremarkable.   IMPRESSION: 1. Asymmetric right hip arthropathy with prominent synovial  thickening and  enhancement. 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 2. No acute osseous findings or evidence of avascular necrosis. 3. Probable small reactive lymph nodes in the right pelvis. No significant inguinal findings.    ASSESSMENT/PLAN:  Hypertension-well controlled. peripheral neuropathy-continue Lyrica. dementia-stable. GERD-stable. right knee OA-cont. Pain meds. Depression-cont. remeron Check cbc, cmp & depakote level  CPT CODE: [id number]  Newton PiggGayani Y. Kerry Doryasanayaka, MD [facility] [phone]

## 2014-07-15 ENCOUNTER — Encounter (HOSPITAL_COMMUNITY): Payer: Self-pay | Admitting: Cardiology

## 2014-09-06 ENCOUNTER — Inpatient Hospital Stay (HOSPITAL_COMMUNITY): Payer: Medicare Other

## 2014-09-06 ENCOUNTER — Ambulatory Visit (HOSPITAL_COMMUNITY): Payer: Medicare Other

## 2014-09-06 ENCOUNTER — Encounter (HOSPITAL_COMMUNITY): Payer: Self-pay | Admitting: *Deleted

## 2014-09-06 ENCOUNTER — Emergency Department (HOSPITAL_COMMUNITY)
Admission: EM | Admit: 2014-09-06 | Discharge: 2014-10-05 | Disposition: E | Payer: Medicare Other | Source: Other Acute Inpatient Hospital | Attending: Internal Medicine | Admitting: Internal Medicine

## 2014-09-06 DIAGNOSIS — X58XXXA Exposure to other specified factors, initial encounter: Secondary | ICD-10-CM | POA: Insufficient documentation

## 2014-09-06 DIAGNOSIS — R911 Solitary pulmonary nodule: Secondary | ICD-10-CM | POA: Insufficient documentation

## 2014-09-06 DIAGNOSIS — W19XXXA Unspecified fall, initial encounter: Secondary | ICD-10-CM | POA: Insufficient documentation

## 2014-09-06 DIAGNOSIS — J439 Emphysema, unspecified: Secondary | ICD-10-CM | POA: Insufficient documentation

## 2014-09-06 DIAGNOSIS — I469 Cardiac arrest, cause unspecified: Secondary | ICD-10-CM

## 2014-09-06 DIAGNOSIS — Z79899 Other long term (current) drug therapy: Secondary | ICD-10-CM | POA: Insufficient documentation

## 2014-09-06 DIAGNOSIS — Z87891 Personal history of nicotine dependence: Secondary | ICD-10-CM | POA: Insufficient documentation

## 2014-09-06 DIAGNOSIS — R1310 Dysphagia, unspecified: Secondary | ICD-10-CM | POA: Insufficient documentation

## 2014-09-06 DIAGNOSIS — K5790 Diverticulosis of intestine, part unspecified, without perforation or abscess without bleeding: Secondary | ICD-10-CM | POA: Insufficient documentation

## 2014-09-06 DIAGNOSIS — R531 Weakness: Secondary | ICD-10-CM | POA: Insufficient documentation

## 2014-09-06 DIAGNOSIS — J811 Chronic pulmonary edema: Secondary | ICD-10-CM | POA: Insufficient documentation

## 2014-09-06 DIAGNOSIS — K222 Esophageal obstruction: Secondary | ICD-10-CM | POA: Insufficient documentation

## 2014-09-06 DIAGNOSIS — I2510 Atherosclerotic heart disease of native coronary artery without angina pectoris: Secondary | ICD-10-CM | POA: Insufficient documentation

## 2014-09-06 DIAGNOSIS — R570 Cardiogenic shock: Secondary | ICD-10-CM

## 2014-09-06 DIAGNOSIS — J9601 Acute respiratory failure with hypoxia: Secondary | ICD-10-CM

## 2014-09-06 LAB — CBC
HEMATOCRIT: 29.7 % — AB (ref 36.0–46.0)
HEMOGLOBIN: 9.8 g/dL — AB (ref 12.0–15.0)
MCH: 29.3 pg (ref 26.0–34.0)
MCHC: 33 g/dL (ref 30.0–36.0)
MCV: 88.9 fL (ref 78.0–100.0)
PLATELETS: 65 10*3/uL — AB (ref 150–400)
RBC: 3.34 MIL/uL — AB (ref 3.87–5.11)
RDW: 13.9 % (ref 11.5–15.5)
WBC: 16.1 10*3/uL — ABNORMAL HIGH (ref 4.0–10.5)

## 2014-09-06 LAB — I-STAT TROPONIN, ED: TROPONIN I, POC: 0.15 ng/mL — AB (ref 0.00–0.08)

## 2014-09-06 LAB — TROPONIN I: TROPONIN I: 0.13 ng/mL — AB (ref <0.031)

## 2014-09-06 LAB — BASIC METABOLIC PANEL
Anion gap: 16 — ABNORMAL HIGH (ref 5–15)
BUN: 11 mg/dL (ref 6–23)
CHLORIDE: 119 mmol/L — AB (ref 96–112)
CO2: 11 mmol/L — AB (ref 19–32)
CREATININE: 1.71 mg/dL — AB (ref 0.50–1.10)
Calcium: 7.4 mg/dL — ABNORMAL LOW (ref 8.4–10.5)
GFR, EST AFRICAN AMERICAN: 32 mL/min — AB (ref >90)
GFR, EST NON AFRICAN AMERICAN: 27 mL/min — AB (ref >90)
Glucose, Bld: 226 mg/dL — ABNORMAL HIGH (ref 70–99)
Potassium: 3.8 mmol/L (ref 3.5–5.1)
SODIUM: 146 mmol/L — AB (ref 135–145)

## 2014-09-06 LAB — BRAIN NATRIURETIC PEPTIDE: B Natriuretic Peptide: 111.3 pg/mL — ABNORMAL HIGH (ref 0.0–100.0)

## 2014-09-06 MED ORDER — FENTANYL BOLUS VIA INFUSION
50.0000 ug | INTRAVENOUS | Status: DC | PRN
  Filled 2014-09-06: qty 200

## 2014-09-06 MED ORDER — MIDAZOLAM HCL 2 MG/2ML IJ SOLN: 2.0000 mg | INTRAMUSCULAR | Status: DC | PRN

## 2014-09-06 MED ORDER — FENTANYL CITRATE 0.05 MG/ML IJ SOLN
25.0000 ug | INTRAMUSCULAR | Status: DC | PRN
  Administered 2014-09-06: 25 ug via INTRAVENOUS
  Filled 2014-09-06: qty 2

## 2014-09-06 MED ORDER — PANTOPRAZOLE SODIUM 40 MG IV SOLR: 40.0000 mg | Freq: Every day | INTRAVENOUS | Status: DC

## 2014-09-06 MED ORDER — SODIUM CHLORIDE 0.9 % IJ SOLN: 3.0000 mL | INTRAMUSCULAR | Status: DC | PRN

## 2014-09-06 MED ORDER — SODIUM CHLORIDE 0.9 % IJ SOLN: 3.0000 mL | Freq: Two times a day (BID) | INTRAMUSCULAR | Status: DC

## 2014-09-06 MED ORDER — NOREPINEPHRINE BITARTRATE 1 MG/ML IV SOLN
2.0000 ug/min | INTRAVENOUS | Status: DC
  Administered 2014-09-06: 2 ug/min via INTRAVENOUS
  Filled 2014-09-06: qty 4

## 2014-09-06 MED ORDER — SODIUM CHLORIDE 0.9 % IV SOLN: 250.0000 mL | INTRAVENOUS | Status: DC | PRN

## 2014-09-06 MED ORDER — EPINEPHRINE HCL 0.1 MG/ML IJ SOSY
PREFILLED_SYRINGE | INTRAMUSCULAR | Status: AC | PRN
  Administered 2014-09-06 (×2): 1 mg via INTRAVENOUS

## 2014-09-06 MED ORDER — SODIUM CHLORIDE 0.9 % IV SOLN
100.0000 ug/h | INTRAVENOUS | Status: DC
  Filled 2014-09-06: qty 50

## 2014-09-06 NOTE — ED Notes (Signed)
Pt taken to CT and back on monitor with RN, RT and EMT.

## 2014-09-06 NOTE — Code Documentation (Signed)
Family at beside. Family given emotional support. 

## 2014-09-06 NOTE — ED Notes (Signed)
Chaplain Paged by Diplomatic Services operational officersecretary for family support.

## 2014-09-06 NOTE — H&P (Signed)
PULMONARY / CRITICAL CARE MEDICINE   Name: Magnus SinningMaebell Thomann MRN: [id number] DOB: 02-13-1933    ADMISSION DATE:  09/23/2014 CONSULTATION DATE:  10/03/2014  REFERRING MD :  EDP  CHIEF COMPLAINT:  PEA arrest  INITIAL PRESENTATION: 81 year old female presented to [facility] ED with PEA arrest with reported ~35 minutes of total downtime. ROSC achieved in ED. PCCM to admit.   STUDIES:  Ct head 2/1 >no acute intracranial pathology.   SIGNIFICANT EVENTS:   HISTORY OF PRESENT ILLNESS:  81 year old female with PMH as below, which includes NSTEMI (possibly takatsubo event per cardiology notes), COPD, lung nodule, Chronic diastolic CHF, and Dementia. [facility] she has had a 1 day history of generalized weakness and falls x 3 which is not normal. 2/1 she was found to be unresponsive and EMS was called. Upon their presentation she was noted to be in PEA. They initiated ACLS and provided CPR and epinephrine x 6. This episode of resuscitation repotedly lasted for 31 minutes. She then coded again en route to ED with an additional 3 min on ACLS and epi x 1. She was intubated. Bedisde ECHO by EDP was not suspicious for RV dilation. PCCM asked to see for admission.   PAST MEDICAL HISTORY :   has a past medical history of Emphysema; Lung nodule; Diverticulosis of colon; Dysphagia; Peptic stricture of esophagus; Ejection fraction; and Chest pain.  has past surgical history that includes left heart catheterization with coronary angiogram (N/A, 03/30/2013). Prior to Admission medications   Medication Sig Start Date End Date Taking? Authorizing Provider  amLODipine (NORVASC) 2.5 MG tablet Take 2.5 mg by mouth daily.    Historical Provider, MD  calcium-vitamin D (OSCAL WITH D) 500-200 MG-UNIT per tablet Take 1 tablet by mouth daily.    Historical Provider, MD  diclofenac sodium (VOLTAREN) 1 % GEL Apply 2 g topically 4 (four) times daily. To right knee 04/01/13   Sharee Holstereborah S Green, NP  divalproex (DEPAKOTE)  250 MG DR tablet Take 250 mg by mouth at bedtime.    Historical Provider, MD  donepezil (ARICEPT) 10 MG tablet Take 10 mg by mouth at bedtime.    Historical Provider, MD  HYDROcodone-acetaminophen (NORCO/VICODIN) 5-325 MG per tablet Take one tablet by mouth three times daily for pain 05/05/14   Kimber RelicArthur G Green, MD  Memantine HCl (NAMENDA XR PO) Take 5 mg by mouth 2 (two) times daily.    Historical Provider, MD  nitroGLYCERIN (NITROSTAT) 0.4 MG SL tablet Place 0.4 mg under the tongue every 5 (five) minutes as needed for chest pain.    Historical Provider, MD  omeprazole (PRILOSEC) 40 MG capsule Take 40 mg by mouth daily.    Historical Provider, MD  pregabalin (LYRICA) 50 MG capsule Take 50 mg by mouth 2 (two) times daily.    Historical Provider, MD   Allergies  Allergen Reactions  . Ultram [Tramadol]     Listed on MAR    FAMILY HISTORY:  has no family status information on file.  SOCIAL HISTORY:  reports that she has quit smoking. She does not have any smokeless tobacco history on file. She reports that she does not drink alcohol or use illicit drugs.  REVIEW OF SYSTEMS:  Unable to assess  SUBJECTIVE:   VITAL SIGNS: Temp:  [93.7 F (34.3 C)-94.6 F (34.8 C)] 93.7 F (34.3 C) (02/01 2340) Pulse Rate:  [78-127] 86 (02/01 2340) Resp:  [13-82] 17 (02/01 2340) BP: (66-196)/(45-93) 142/77 mmHg (02/01  2340) SpO2:  [71 %-99 %] 87 % (02/01 2340) FiO2 (%):  [100 %] 100 % (02/01 2335) Weight:  [79.833 kg (176 lb)] 79.833 kg (176 lb) (02/01 2205) HEMODYNAMICS:   VENTILATOR SETTINGS: Vent Mode:  [-] PRVC FiO2 (%):  [100 %] 100 % Set Rate:  [20 bmp] 20 bmp Vt Set:  [450 mL] 450 mL PEEP:  [5 cmH20-8 cmH20] 8 cmH20 Plateau Pressure:  [26 cmH20] 26 cmH20 INTAKE / OUTPUT:  Intake/Output Summary (Last 24 hours) at 09/29/2014 0011 Last data filed at 09-15-14 2227  Gross per 24 hour  Intake   3500 ml  Output      0 ml  Net   3500 ml    PHYSICAL EXAMINATION: General:  Elderly female on  vent Neuro:  Obtunded on vent with no sedation HEENT: ETT in place, no JVD noted Cardiovascular:  RRR, no MRG noted Lungs:  Diffuse rhonchi, synchronous with vent. Abdomen: Soft, non-distended Musculoskeletal:  No acute deformity or ROM limitation Skin:  Grossly intact  LABS:  CBC  Recent Labs Lab 09-15-2014 2201  WBC 16.1*  HGB 9.8*  HCT 29.7*  PLT 65*   Coag's No results for input(s): APTT, INR in the last 168 hours. BMET  Recent Labs Lab Sep 15, 2014 2201  NA 146*  K 3.8  CL 119*  CO2 11*  BUN 11  CREATININE 1.71*  GLUCOSE 226*   Electrolytes  Recent Labs Lab Sep 15, 2014 2201  CALCIUM 7.4*   Sepsis Markers No results for input(s): LATICACIDVEN, PROCALCITON, O2SATVEN in the last 168 hours. ABG No results for input(s): PHART, PCO2ART, PO2ART in the last 168 hours. Liver Enzymes No results for input(s): AST, ALT, ALKPHOS, BILITOT, ALBUMIN in the last 168 hours. Cardiac Enzymes  Recent Labs Lab 09/15/2014 2201  TROPONINI 0.13*   Glucose No results for input(s): GLUCAP in the last 168 hours.  Imaging Ct Head Wo Contrast  2014/09/15   CLINICAL DATA:  Acute onset of generalized weakness. Unresponsive. Status post CPR. Initial encounter.  EXAM: CT HEAD WITHOUT CONTRAST  TECHNIQUE: Contiguous axial images were obtained from the base of the skull through the vertex without intravenous contrast.  COMPARISON:  CT of the head from 02/24/2009  FINDINGS: There is no evidence of acute infarction, mass lesion, or intra- or extra-axial hemorrhage on CT.  Prominence of the ventricles and sulci reflects mild to moderate cortical volume loss. Vague loss of gray-white matter differentiation at the high frontoparietal regions bilaterally is thought to reflect volume averaging and prominent sulci, given a somewhat similar appearance on the prior study and relative symmetry. Scattered periventricular and subcortical white matter change likely reflects small vessel ischemic microangiopathy.   The brainstem and fourth ventricle are within normal limits. The basal ganglia are unremarkable in appearance. No mass effect or midline shift is seen.  There is no evidence of fracture; visualized osseous structures are unremarkable in appearance. The orbits are within normal limits. There is opacification of the sphenoid sinus, and mild partial opacification of the maxillary sinuses and ethmoid air cells. The remaining paranasal sinuses and mastoid air cells are well-aerated. Soft tissue swelling is noted overlying the left occiput.  IMPRESSION: 1. No acute intracranial pathology seen on CT. Vague loss of gray-white matter differentiation at the high frontoparietal regions bilaterally is thought to be artifactual in nature. 2. Mild to moderate cortical volume loss and scattered small vessel ischemic microangiopathy. 3. Soft tissue swelling overlying the left occiput. 4. Opacification of the sphenoid sinus, and mild partial opacification  of the maxillary sinuses and ethmoid air cells.   Electronically Signed   By: Roanna Raider M.D.   On: 09/25/14 22:56   Dg Chest Port 1 View  2014-09-25   CLINICAL DATA:  Repositioning of endotracheal tube. Initial encounter.  EXAM: PORTABLE CHEST - 1 VIEW  COMPARISON:  Chest radiograph performed earlier today at 10:02 p.m.  FINDINGS: The patient's endotracheal tube is seen ending 2-3 cm above the carina. The enteric tube is noted extending below the diaphragm, with the side port noted about the gastroesophageal junction.  There is persistent bilateral airspace opacification, more prominent on the right. This may reflect asymmetric pulmonary edema or pneumonia. No definite pleural effusion or pneumothorax is seen.  The cardiomediastinal silhouette is borderline enlarged. No acute osseous abnormalities are identified.  IMPRESSION: 1. Endotracheal tube seen ending 2-3 cm above the carina. 2. Enteric tube noted extending below the diaphragm, with the side port noted about the  gastroesophageal junction. 3. Persistent bilateral airspace opacification, worse on the right. This makes reflect asymmetric pulmonary edema or pneumonia. 4. Borderline cardiomegaly.   Electronically Signed   By: Roanna Raider M.D.   On: 09-25-2014 23:19   Dg Chest Port 1 View  Sep 25, 2014   CLINICAL DATA:  Post arrest, intubation.  EXAM: PORTABLE CHEST - 1 VIEW  COMPARISON:  04/18/2014  FINDINGS: Tip of the endotracheal tube is in the right mainstem bronchus, retraction of 2 cm recommended. There is an enteric tube in place, tip below the diaphragm, not included in field of view. Probable esophageal temperature probe at the thoracic inlet. Mild enlargement of cardiac silhouette from prior exam. There is atherosclerosis of thoracic aorta. Bilateral mixed alveolar and interstitial opacities, likely pulmonary edema. This is asymmetric, right greater than left. Calcified nodule in the right upper lobe, unchanged. No large pleural effusion or pneumothorax. No acute osseous abnormalities are seen.  IMPRESSION: 1. Right mainstem intubation, retraction of the least 2 cm recommended. Enteric tube in place, below the diaphragm not included in the field of view. 2. Mixed bilateral alveolar and interstitial opacities concerning for pulmonary edema, asymmetric with right greater than left.  These results were called by telephone at the time of interpretation on 09-25-2014 at 10:21 pm to Dr. Jaci Carrel , who verbally acknowledged these results.   Electronically Signed   By: Rubye Oaks M.D.   On: Sep 25, 2014 22:24     ASSESSMENT / PLAN:  Cardiogenic shock s/p PEA arrest x 2 Acute on chronic diastolic CHF, likely now systolic Acute hypoxemic respiratory failure Pulmonary edema   - During evaluation and family discussion patient was noted to have another cardiac arrest by ED staff. This time rhythm was asystole. She underwent another 5 minutes of ACLS with epinephrine x 2. Family was present entire time. I  had a conversation with them and discussed possible outcomes including low likelihood of meaningful recovery after 3 cardiac arrests. Her son and POA tells me that she would never want to be on a ventilator or other forms of life support. They have been through this in the past, and based on that they would not want to see her go through it. They wish to pursue a withdrawal of all medical care at this time.    Joneen Roach, AGACNP-BC [facility] Pulmonology/Critical Care Pager [phone] or [phone]

## 2014-09-06 NOTE — ED Notes (Signed)
Pt arrives via EMS [facility]. Staff called EMS reporting general weakness that is continually getting worse. Upon EMS arrival they found her unresponsive and without pulse. EMS initiated CPR at 2054, originally in PEA. Gave 6 epi's. Pulses returned at 2125 HR in 40's . Pt lost pulses at 2138, received 1 epi. Pt arrived to ED at 2140 on [place]. Pulse check at 2141 with positive pulses. Pt received 2L cold saline.

## 2014-09-06 NOTE — ED Notes (Addendum)
Called to bedside to assist by primary RN. Current monitor rhythm shows asystole, pulses absent. CPR started. MD Polina called to bedside.

## 2014-09-06 NOTE — ED Notes (Signed)
Critical are at bedside.

## 2014-09-06 NOTE — Procedures (Addendum)
Intubation Procedure Note Sharon [name] [id number] 05/10/1933  Procedure: Intubation at approximatly 21:50 Indications: Respiratory insufficiency and CPR  Pt intubated by physician on first attempt. Size 7.5 ETT tube secured at initially 23 cm and verified by CO2 color changer, BS, chest rise, and SAT monitor. Initial vent settings are: PRVC, VT 450, RR 20, PEEP 5, and 100% FiO2.   Procedure Details Consent: Unable to obtain consent because of emergent medical necessity. Time Out: Verified patient identification, verified procedure, site/side was marked, verified correct patient position, special equipment/implants available, medications/allergies/relevent history reviewed, required imaging and test results available.  Performed  Maximum sterile technique was used including gloves, gown, hand hygiene and mask.  Physician used a  MAC and 3 blade and successfully intubated pt on first attempt.   Evaluation Hemodynamic Status: Transient hypotension treated with pressors; O2 sats: currently acceptable Patient's Current Condition: unstable Complications: No apparent complications Patient did tolerate procedure well. Chest X-ray ordered to verify placement.  CXR: pending.   Sharon MilletPorter, Aron [name] D 09/15/2014

## 2014-09-06 NOTE — ED Provider Notes (Signed)
CSN: [id number]     Arrival date & time 09/18/2014  2143 History   First MD Initiated Contact with Patient 09/28/2014 2158     No chief complaint on file.    (Consider location/radiation/quality/duration/timing/severity/associated sxs/prior Treatment) HPI 81 year old female with past medical history as below notable for CAD who presents to ED via EMS with CPR ongoing. Per EMS report, patient was feeling unwell earlier in the day and generally weak. Pt fell 3 times today per family. Weakness persisted through today and apparently patient became unresponsive this evening around 7 PM. EMS was called out on their arrival patient was pulseless and unresponsive. CPR was initiated that time. Pt intubated. EMS reports patient initially in PEA previously and 6 epis were given. ROSC achieved and then pulses lost just prior to arrival. Additionally, ETT became dislodged when moving stretcher off truck. BVM started.   Son is POA and is on way.   Past Medical History  Diagnosis Date  . Emphysema   . Lung nodule   . Diverticulosis of colon   . Dysphagia   . Peptic stricture of esophagus   . Ejection fraction   . Chest pain    Past Surgical History  Procedure Laterality Date  . Left heart catheterization with coronary angiogram N/A 03/30/2013    Procedure: LEFT HEART CATHETERIZATION WITH CORONARY ANGIOGRAM;  Surgeon: Peter M Sw[place], MD;  Location: [facility] CATH LAB;  Service: Cardiovascular;  Laterality: N/A;   No family history on file. History  Substance Use Topics  . Smoking status: Former Games developer  . Smokeless tobacco: Not on file  . Alcohol Use: No   OB History    No data available     Review of Systems Unable to obtain 2/2 pt condition   Allergies  Ultram  Home Medications   Prior to Admission medications   Medication Sig Start Date End Date Taking? Authorizing Provider  amLODipine (NORVASC) 2.5 MG tablet Take 2.5 mg by mouth daily.    Historical Provider, MD  calcium-vitamin D (OSCAL  WITH D) 500-200 MG-UNIT per tablet Take 1 tablet by mouth daily.    Historical Provider, MD  diclofenac sodium (VOLTAREN) 1 % GEL Apply 2 g topically 4 (four) times daily. To right knee 04/01/13   Sharee Holster, NP  divalproex (DEPAKOTE) 250 MG DR tablet Take 250 mg by mouth at bedtime.    Historical Provider, MD  donepezil (ARICEPT) 10 MG tablet Take 10 mg by mouth at bedtime.    Historical Provider, MD  HYDROcodone-acetaminophen (NORCO/VICODIN) 5-325 MG per tablet Take one tablet by mouth three times daily for pain 05/05/14   Kimber Relic, MD  Memantine HCl (NAMENDA XR PO) Take 5 mg by mouth 2 (two) times daily.    Historical Provider, MD  nitroGLYCERIN (NITROSTAT) 0.4 MG SL tablet Place 0.4 mg under the tongue every 5 (five) minutes as needed for chest pain.    Historical Provider, MD  omeprazole (PRILOSEC) 40 MG capsule Take 40 mg by mouth daily.    Historical Provider, MD  pregabalin (LYRICA) 50 MG capsule Take 50 mg by mouth 2 (two) times daily.    Historical Provider, MD   BP 86/45 mmHg  Resp 16 Physical Exam  Constitutional: She has a sickly appearance.  Chronically ill appearing female arrives with CPR ongoing and BVM providing respirations.  HENT:  Head: Normocephalic and atraumatic.  Blood coming from mouth  Eyes:  Pupils 6 mm and nonreactive BL  Neck: No tracheal deviation present.  Cardiovascular: Bradycardia present.  Exam reveals no decreased pulses.   Pulses:      Carotid pulses are 2+ on the right side, and 2+ on the left side.      Femoral pulses are 1+ on the right side, and 1+ on the left side. Pulmonary/Chest: Bradypnea noted. She has rhonchi (diffuse BL).  Agonal breathing on arrival  Abdominal: Soft. Normal appearance. She exhibits no pulsatile midline mass and no mass. There is no tenderness.  Musculoskeletal:  IO in R tibia  Neurological: She is unresponsive. GCS eye subscore is 1. GCS verbal subscore is 1. GCS motor subscore is 4.  Skin: Skin is warm and  dry. She is not diaphoretic.    ED Course  INTUBATION Date/Time: 09-15-2014 10:35 PM Performed by: Ames Dura Authorized by: Ames Dura Consent: The procedure was performed in an emergent situation. Indications: airway protection and  respiratory failure Intubation method: direct Patient status: unconscious Preoxygenation: BVM Laryngoscope size: Mac 3 Tube size: 7.5 mm Tube type: cuffed Number of attempts: 1 Cricoid pressure: no Cords visualized: yes Post-procedure assessment: chest rise and CO2 detector Breath sounds: equal and absent over the epigastrium Cuff inflated: yes ETT to lip: 25 cm ETT to teeth: 24 cm Tube secured with: ETT holder Chest x-ray interpreted by radiologist. Chest x-ray findings: endotracheal tube too low Tube repositioned: tube repositioned successfully Patient tolerance: Patient tolerated the procedure well with no immediate complications   (including critical care time) Labs Review Labs Reviewed  CBC - Abnormal; Notable for the following:    WBC 16.1 (*)    RBC 3.34 (*)    Hemoglobin 9.8 (*)    HCT 29.7 (*)    All other components within normal limits  I-STAT TROPOININ, ED - Abnormal; Notable for the following:    Troponin i, poc 0.15 (*)    All other components within normal limits  BASIC METABOLIC PANEL  BRAIN NATRIURETIC PEPTIDE  TROPONIN I    Imaging Review Dg Chest Port 1 View  15-Sep-2014   CLINICAL DATA:  Post arrest, intubation.  EXAM: PORTABLE CHEST - 1 VIEW  COMPARISON:  04/18/2014  FINDINGS: Tip of the endotracheal tube is in the right mainstem bronchus, retraction of 2 cm recommended. There is an enteric tube in place, tip below the diaphragm, not included in field of view. Probable esophageal temperature probe at the thoracic inlet. Mild enlargement of cardiac silhouette from prior exam. There is atherosclerosis of thoracic aorta. Bilateral mixed alveolar and interstitial opacities, likely pulmonary edema. This is  asymmetric, right greater than left. Calcified nodule in the right upper lobe, unchanged. No large pleural effusion or pneumothorax. No acute osseous abnormalities are seen.  IMPRESSION: 1. Right mainstem intubation, retraction of the least 2 cm recommended. Enteric tube in place, below the diaphragm not included in the field of view. 2. Mixed bilateral alveolar and interstitial opacities concerning for pulmonary edema, asymmetric with right greater than left.  These results were called by telephone at the time of interpretation on 2014/09/15 at 10:21 pm to Dr. Jaci Carrel , who verbally acknowledged these results.   Electronically Signed   By: Rubye Oaks M.D.   On: 15-Sep-2014 22:24     EKG Interpretation None        EMERGENCY DEPARTMENT [place] CARDIAC EXAM "Study: Limited Ultrasound of the heart and pericardium"  INDICATIONS:Cardiac arrest Multiple views of the heart and pericardium are obtained with a multi-frequency probe.  PERFORMED ZO:[id number]  IMAGES ARCHIVED?: No  FINDINGS: No pericardial  effusion, Decreased contractility and Tamponade physiology absent, RV normal size  LIMITATIONS:  Emergent procedure  VIEWS USED: Subcostal 4 chamber and Apical 4 chamber   INTERPRETATION: Cardiac activity present, Pericardial effusioin absent, Cardiac tamponade absent and Decreased contractility    MDM   Final diagnoses:  None   Magnus SinningMaebell Varden is a 81 y.o. female with H&P as above. Patient arrives with CPR ongoing. EKG by EMS was reviewed and crit STEMI was paged out. This is a patient with known coronary artery disease, dementia, coming from nursing home where she was reported to be very weak today and have multiple falls. Total time of CPR prior to arrival was 45 minutes. On arrival, patient has carotid pulse and CPR was discontinued. Patient noted to have agonal respirations and be moving her upper extremities and head. Patient was intubated as above. Initial blood pressure was 80  systolic. Repeat EKG was done and does not show signs of ACS at this time. Cards was present and they agree to discontinue code STEMI. Bedside ultrasound was performed and cardiac echo was without obvious large pericardial effusion and right ventricle was noted to be normal size. Additional differentials include PE, head bleed, electrolyte abnormality. Critical care was consulted at this time. Workup notable for elevated troponin of 0.15. Chest x-ray which does not show pneumonia or pneumothorax, but does show pulmonary edema. Critical Care to admit pt.  While in ED, pt lost pulses again and CPR was initiated and ROSC was obtained following 2 epis and 5 min CPR. Family at bedside. BP noted to be 190 sys. Pupils 6 mm and nonreactive. Critical Care discussion with son (POA), who decides he would like to withdraw care. Pt made DNR/DNI.  Pt expired at 0030.   Pt seen in conjunction with Dr. Gilda Creasehristopher J. Pollina, *  Ames DuraStephen Madasyn Heath, DO [facility] Resident - PGY-2      Ames DuraStephen Britain Saber, MD 09/23/2014 [id number]  Gilda Creasehristopher J. Pollina, MD 09/11/14 [phone]

## 2014-09-06 NOTE — ED Notes (Signed)
Family at bedside. 

## 2014-09-07 DIAGNOSIS — I469 Cardiac arrest, cause unspecified: Secondary | ICD-10-CM | POA: Insufficient documentation

## 2014-09-07 DIAGNOSIS — J9601 Acute respiratory failure with hypoxia: Secondary | ICD-10-CM | POA: Insufficient documentation

## 2014-09-07 DIAGNOSIS — R570 Cardiogenic shock: Secondary | ICD-10-CM | POA: Insufficient documentation

## 2014-09-07 LAB — HIV RAPID SCREEN (BLD OR BODY FLD EXPOSURE): SUDS RAPID HIV SCREEN: NONREACTIVE

## 2014-09-07 LAB — HEPATITIS B SURFACE ANTIGEN: Hepatitis B Surface Ag: NEGATIVE

## 2014-09-07 MED FILL — Medication: Qty: 1 | Status: AC

## 2014-09-10 LAB — HEPATITIS C ANTIBODY (REFLEX): HCV Ab: NEGATIVE

## 2014-10-05 DIAGNOSIS — 419620001 Death: Secondary | SNOMED CT | POA: Insufficient documentation

## 2014-10-05 NOTE — Consult Note (Signed)
Brief Consult Note  Paged by ED staff to urgently evaluated Ms. Sharon [name], a 81 year old female who was post cardiac arrest. She has a history of dementia, GERD, and a MI that was attributed to Takotsubo's cardiomyopathy. EMS was initially called to the NH due to looking unwell with concern for dehydration. On arrival, she coded.  PEA. After 30 minutes of CPR with 8 rounds of epi, ECG demonstrated some borderline inferior repolarization changes that were concerning for STEMI and ED staff was alerted from the field. [facility] she coded again for a few minutes and again ROSC was achieved. The patient was intubated. She was noted to have evidence of pulmonary hemorrhage. Although EMS reported some spontaneous movement in the truck, none was evident in the ER.  ECG review demonstrated ECGs obtained after ROSC were 1mm STE in III and <421mm in aVF. STD were noted in the lateral and precordial leads. After the patient was settled in the ER and airway was secure, a multiple ECGs was obtained, confirming no STEMI. At this point she was hemodynamically stable.   Based on the clinical scenario, it seemed that an ACS was unlikely to have caused the arrest and that the repolarization changes were more likely a consequence of global hypoperfusion. PEA is not a typical arrest rhythm in the setting of ACS - much more likely to be VT or VF. As a result, the code STEMI was cancelled with plans for the patient to be seen and evaluated by CCM.   A few hours after my initial evaluation, Ms. Sharon [name] coded again and eventually was made DNR and passed.

## 2014-10-05 NOTE — ED Notes (Signed)
Spoke with MD Colonel BaldKish from pathology regarding postmortem blood draw for exposure panel, he stated that an ED Physician could perform an arterial blood draw to obtain labs. Chrissie NoaWilliam RN [facility] aware of this and in agreement that policy does not dispute this. MD Jacubowitz to bedside and obtained blood from right femoral stick.

## 2014-10-05 NOTE — ED Notes (Signed)
All jewelry and clothing including silver colored watch and bracelet were given to son Chrissie NoaWilliam.

## 2014-10-05 NOTE — Progress Notes (Signed)
Chaplain responded to post CPR going to Trauma B. The patient is a 81 year old woman transferred from a Nursing facility with CPR in progress. The patient is unresponsive at the time of arrival to the hospital, family was notified and is present and escorted to the family consult room. The Dr.explained the critical medical status of the patient and the possibility of her condition not improving.  A great gathering of family of thirty or more came to the hospital in support of their family member. One son collapsed in the halfway and had to be taken to the ED for medical assistance. The family was large but orderly in their as they were informed of the patient's death, and viewing of the body.  There were extensive acts of comfort, prayer of comfort and peace, shared life stories of patient and gathering of needed information for the medical staff.  The family was very apprective of all acts of kindness shown to their family. On-Call Chaplain Janell QuietAudrey Thornton [phone]

## 2014-10-05 DEATH — deceased
